# Patient Record
Sex: Female | Born: 1960 | Race: Black or African American | Hispanic: No | Smoking: Former smoker
Health system: Southern US, Community
[De-identification: ages and names within clinical notes are randomized; demographics above are authoritative.]

## PROBLEM LIST (undated history)

## (undated) HISTORY — PX: NO PAST SURGERIES: SHX2092

---

## 2000-12-04 ENCOUNTER — Ambulatory Visit (HOSPITAL_COMMUNITY): Admission: RE | Admit: 2000-12-04 | Discharge: 2000-12-04 | Payer: Self-pay | Admitting: Family Medicine

## 2002-02-13 ENCOUNTER — Ambulatory Visit (HOSPITAL_COMMUNITY): Admission: RE | Admit: 2002-02-13 | Discharge: 2002-02-13 | Payer: Self-pay | Admitting: Family Medicine

## 2004-03-21 ENCOUNTER — Emergency Department (HOSPITAL_COMMUNITY): Admission: EM | Admit: 2004-03-21 | Discharge: 2004-03-21 | Payer: Self-pay | Admitting: Emergency Medicine

## 2005-04-21 ENCOUNTER — Ambulatory Visit: Payer: Self-pay | Admitting: Family Medicine

## 2005-04-21 ENCOUNTER — Ambulatory Visit (HOSPITAL_COMMUNITY): Admission: RE | Admit: 2005-04-21 | Discharge: 2005-04-21 | Payer: Self-pay | Admitting: Internal Medicine

## 2005-10-07 ENCOUNTER — Ambulatory Visit: Payer: Self-pay | Admitting: Internal Medicine

## 2005-10-10 ENCOUNTER — Ambulatory Visit: Payer: Self-pay | Admitting: Internal Medicine

## 2006-11-25 ENCOUNTER — Emergency Department (HOSPITAL_COMMUNITY): Admission: EM | Admit: 2006-11-25 | Discharge: 2006-11-25 | Payer: Self-pay | Admitting: Emergency Medicine

## 2007-01-23 ENCOUNTER — Ambulatory Visit: Payer: Self-pay | Admitting: Internal Medicine

## 2007-01-26 ENCOUNTER — Ambulatory Visit: Payer: Self-pay | Admitting: Internal Medicine

## 2007-01-29 ENCOUNTER — Ambulatory Visit: Payer: Self-pay | Admitting: Internal Medicine

## 2007-06-25 ENCOUNTER — Encounter (INDEPENDENT_AMBULATORY_CARE_PROVIDER_SITE_OTHER): Payer: Self-pay | Admitting: Family Medicine

## 2007-06-25 ENCOUNTER — Ambulatory Visit: Payer: Self-pay | Admitting: Internal Medicine

## 2007-06-25 LAB — CONVERTED CEMR LAB
AST: 21 units/L (ref 0–37)
Albumin: 4 g/dL (ref 3.5–5.2)
Alkaline Phosphatase: 39 units/L (ref 39–117)
CO2: 20 meq/L (ref 19–32)
Calcium: 9 mg/dL (ref 8.4–10.5)
Chloride: 109 meq/L (ref 96–112)
Cholesterol: 199 mg/dL (ref 0–200)
Creatinine, Ser: 0.96 mg/dL (ref 0.40–1.20)
Eosinophils Relative: 1 % (ref 0–5)
HDL: 69 mg/dL (ref >39)
Hemoglobin: 11.9 g/dL — ABNORMAL LOW (ref 12.0–15.0)
Neutro Abs: 6.4 10*3/uL (ref 1.7–7.7)
Platelets: 298 10*3/uL (ref 150–400)
Potassium: 4.3 meq/L (ref 3.5–5.3)
Sodium: 140 meq/L (ref 135–145)
Total CHOL/HDL Ratio: 2.9
Total Protein: 6.8 g/dL (ref 6.0–8.3)
VLDL: 48 mg/dL — ABNORMAL HIGH (ref 0–40)
WBC: 9.1 10*3/uL (ref 4.0–10.5)

## 2007-06-27 ENCOUNTER — Ambulatory Visit (HOSPITAL_COMMUNITY): Admission: RE | Admit: 2007-06-27 | Discharge: 2007-06-27 | Payer: Self-pay | Admitting: Family Medicine

## 2007-12-11 ENCOUNTER — Emergency Department (HOSPITAL_COMMUNITY): Admission: EM | Admit: 2007-12-11 | Discharge: 2007-12-11 | Payer: Self-pay | Admitting: Emergency Medicine

## 2007-12-14 ENCOUNTER — Ambulatory Visit: Payer: Self-pay | Admitting: Internal Medicine

## 2008-01-22 ENCOUNTER — Emergency Department (HOSPITAL_COMMUNITY): Admission: EM | Admit: 2008-01-22 | Discharge: 2008-01-22 | Payer: Self-pay | Admitting: Emergency Medicine

## 2008-03-11 ENCOUNTER — Ambulatory Visit: Payer: Self-pay | Admitting: Family Medicine

## 2008-03-13 ENCOUNTER — Ambulatory Visit: Payer: Self-pay | Admitting: Internal Medicine

## 2010-02-21 HISTORY — PX: BREAST BIOPSY: SHX20

## 2010-10-26 ENCOUNTER — Other Ambulatory Visit: Payer: Self-pay

## 2010-12-02 LAB — POCT CARDIAC MARKERS
CKMB, poc: 1.7
CKMB, poc: 3.4
Myoglobin, poc: 108
Myoglobin, poc: 190
Operator id: 1211
Operator id: 3206
Troponin i, poc: <0.05
Troponin i, poc: <0.05

## 2010-12-02 LAB — DIFFERENTIAL
Basophils Absolute: 0.1
Basophils Relative: 1
Lymphocytes Relative: 20
Lymphs Abs: 2.1
Monocytes Absolute: 0.5
Monocytes Relative: 5
Neutro Abs: 7.8 — ABNORMAL HIGH

## 2010-12-02 LAB — CBC
Hemoglobin: 12.2
RBC: 4.03

## 2010-12-02 LAB — D-DIMER, QUANTITATIVE: D-Dimer, Quant: 0.56 — ABNORMAL HIGH

## 2010-12-02 LAB — BASIC METABOLIC PANEL
Calcium: 9.5
Chloride: 107
Glucose, Bld: 89
Potassium: 4.7

## 2010-12-06 ENCOUNTER — Other Ambulatory Visit (HOSPITAL_COMMUNITY): Payer: Self-pay | Admitting: Family Medicine

## 2010-12-06 DIAGNOSIS — Z1231 Encounter for screening mammogram for malignant neoplasm of breast: Secondary | ICD-10-CM

## 2010-12-17 ENCOUNTER — Ambulatory Visit (HOSPITAL_COMMUNITY)
Admission: RE | Admit: 2010-12-17 | Discharge: 2010-12-17 | Disposition: A | Discharge status: Home / Self Care | Payer: Self-pay | Source: Ambulatory Visit | Attending: Family Medicine | Admitting: Family Medicine

## 2010-12-17 DIAGNOSIS — Z1231 Encounter for screening mammogram for malignant neoplasm of breast: Secondary | ICD-10-CM | POA: Insufficient documentation

## 2010-12-23 ENCOUNTER — Other Ambulatory Visit: Payer: Self-pay | Admitting: Family Medicine

## 2010-12-23 DIAGNOSIS — R928 Other abnormal and inconclusive findings on diagnostic imaging of breast: Secondary | ICD-10-CM

## 2010-12-30 ENCOUNTER — Ambulatory Visit
Admission: RE | Admit: 2010-12-30 | Discharge: 2010-12-30 | Disposition: A | Discharge status: Home / Self Care | Payer: No Typology Code available for payment source | Source: Ambulatory Visit | Attending: Family Medicine | Admitting: Family Medicine

## 2010-12-30 ENCOUNTER — Other Ambulatory Visit: Payer: Self-pay | Admitting: Family Medicine

## 2010-12-30 DIAGNOSIS — R928 Other abnormal and inconclusive findings on diagnostic imaging of breast: Secondary | ICD-10-CM

## 2011-01-04 ENCOUNTER — Encounter: Payer: Self-pay | Admitting: *Deleted

## 2011-01-04 ENCOUNTER — Ambulatory Visit (INDEPENDENT_AMBULATORY_CARE_PROVIDER_SITE_OTHER): Payer: No Typology Code available for payment source | Admitting: *Deleted

## 2011-01-04 VITALS — BP 134/94 | HR 76 | Temp 97.8°F | Ht 65.5 in | Wt 244.5 lb

## 2011-01-04 DIAGNOSIS — N63 Unspecified lump in unspecified breast: Secondary | ICD-10-CM

## 2011-01-04 DIAGNOSIS — N6310 Unspecified lump in the right breast, unspecified quadrant: Secondary | ICD-10-CM

## 2011-01-04 DIAGNOSIS — Z1239 Encounter for other screening for malignant neoplasm of breast: Secondary | ICD-10-CM

## 2011-01-04 NOTE — Patient Instructions (Signed)
Taught patient how to perform BSE and gave educational materials to take home. Patient did not need a Pap smear today due to last Pap smear was in 10/26/10. Told patient about free cervical cancer screenings to receive a Pap smear if would like one within 1-2 years. Let her know [facility] will cover Pap smears every 3 years unless has a history of abnormal Pap smears. Patient is scheduled for a right breast biopsy tomorrow, November 14th at 1430 at the [facility] [place] . Patient aware of appointment and will be there. Let patient know will follow up with her within the next couple weeks with results either by letter or phone. Also, talked with patient about smoking cessation. Patient is ready to quit. Gave her information about the free smoking cessation classes offered at the [facility] if interested. Patient verbalized understanding.

## 2011-01-04 NOTE — Progress Notes (Signed)
Complaints of right breast lump.  Pap Smear:    Pap smear not performed today. Last Pap smear was 10/26/10 at [facility] and was normal. Per patient has no history of abnormal Pap smears. Per BCCCP guidelines patient does not need a Pap smear for 3 years if has no history of abnormal Pap smears. Told patient about free Pap smear screenings if would like a Pap smear at 1-2 years.  Physical exam: Breasts Breasts symmetrical. No skin abnormalities bilateral breasts. No nipple retraction bilateral breasts. No nipple discharge bilateral breasts. No lymphadenopathy. No lumps palpated left breast. Lump palpated in the right breast around 5 o'clock 5cm from nipple. No pain or tenderness bilateral breasts on palpation. Biopsy of right breast lump is scheduled at the [facility] for tomorrow November 14th at 1430.      Pelvic/Bimanual No Pap smear and no pelvic exam completed today since patients last Pap smear was a little over two months ago. Pap smear not indicated per BCCCP guidelines since last Pap smear was less than 3 years ago.

## 2011-01-05 ENCOUNTER — Ambulatory Visit
Admission: RE | Admit: 2011-01-05 | Discharge: 2011-01-05 | Disposition: A | Discharge status: Home / Self Care | Payer: No Typology Code available for payment source | Source: Ambulatory Visit | Attending: Family Medicine | Admitting: Family Medicine

## 2011-01-05 DIAGNOSIS — R928 Other abnormal and inconclusive findings on diagnostic imaging of breast: Secondary | ICD-10-CM

## 2011-12-21 ENCOUNTER — Other Ambulatory Visit: Payer: Self-pay | Admitting: Obstetrics and Gynecology

## 2011-12-21 DIAGNOSIS — Z1231 Encounter for screening mammogram for malignant neoplasm of breast: Secondary | ICD-10-CM

## 2011-12-27 ENCOUNTER — Encounter (HOSPITAL_COMMUNITY): Payer: Self-pay | Admitting: *Deleted

## 2012-01-10 ENCOUNTER — Encounter (HOSPITAL_COMMUNITY): Payer: Self-pay

## 2012-01-10 ENCOUNTER — Ambulatory Visit (HOSPITAL_COMMUNITY)
Admission: RE | Admit: 2012-01-10 | Discharge: 2012-01-10 | Disposition: A | Discharge status: Home / Self Care | Payer: Self-pay | Source: Ambulatory Visit | Attending: Obstetrics and Gynecology | Admitting: Obstetrics and Gynecology

## 2012-01-10 VITALS — BP 118/72 | Temp 98.2°F | Ht 63.0 in | Wt 234.6 lb

## 2012-01-10 DIAGNOSIS — Z1239 Encounter for other screening for malignant neoplasm of breast: Secondary | ICD-10-CM

## 2012-01-10 DIAGNOSIS — Z1231 Encounter for screening mammogram for malignant neoplasm of breast: Secondary | ICD-10-CM

## 2012-01-10 NOTE — Addendum Note (Signed)
Encounter addended by: Saintclair Halsted, RN on: 01/10/2012  8:52 AM<BR>     Documentation filed: Visit Diagnoses, Patient Instructions Section

## 2012-01-10 NOTE — Progress Notes (Signed)
No complaints today.  Pap Smear:    Pap smear not performed today. Patients last Pap smear was 10/26/2010 at [facility] and normal. Per patient she has no history of abnormal Pap smears. Pap smear result above is in EPIC.  Physical exam: Breasts Right breast slightly larger than left breast. No skin abnormalities bilateral breasts. No nipple retraction bilateral breasts. No nipple discharge bilateral breasts. No lymphadenopathy. No lumps palpated bilateral breasts. No complaints of pain or tenderness on exam. Patient to have screening mammogram completed after BCCCP appointment.         Pelvic/Bimanual No Pap smear completed today since last Pap smear was 10/26/2010 and normal. Pap smear not indicated per BCCCP guidelines.

## 2012-01-10 NOTE — Patient Instructions (Addendum)
Taught patient how to perform BSE. Patient did not need a Pap smear today due to last Pap smear was 10/26/2010. Let her know B[facility]CP will cover Pap smears every 3 years unless has a history of abnormal Pap smears. Discussed smoking cessation with patient. Patient completed Quit line referral and told patient about free smoking cessation classes offered at the [facility]. Informed patient on how to register for classes either online or phone number to call. Patient escorted to mammography for a screening mammogram. Let patient know will follow up with her within the next couple weeks with results by letter or phone. Patient verbalized understanding.

## 2012-02-23 NOTE — Addendum Note (Signed)
Encounter addended by: Saintclair Halsted, RN on: 02/23/2012  4:24 PM<BR>     Documentation filed: Visit Diagnoses, Charges VN

## 2013-08-07 ENCOUNTER — Other Ambulatory Visit (HOSPITAL_COMMUNITY): Payer: Self-pay | Admitting: Internal Medicine

## 2013-08-07 DIAGNOSIS — Z1231 Encounter for screening mammogram for malignant neoplasm of breast: Secondary | ICD-10-CM

## 2013-08-12 ENCOUNTER — Ambulatory Visit (HOSPITAL_COMMUNITY)
Admission: RE | Admit: 2013-08-12 | Discharge: 2013-08-12 | Disposition: A | Discharge status: Home / Self Care | Payer: No Typology Code available for payment source | Source: Ambulatory Visit | Attending: Internal Medicine | Admitting: Internal Medicine

## 2013-08-12 DIAGNOSIS — Z1231 Encounter for screening mammogram for malignant neoplasm of breast: Secondary | ICD-10-CM | POA: Insufficient documentation

## 2014-05-19 ENCOUNTER — Encounter: Payer: Self-pay | Admitting: Internal Medicine

## 2014-07-01 ENCOUNTER — Ambulatory Visit (AMBULATORY_SURGERY_CENTER): Payer: Self-pay

## 2014-07-01 VITALS — Ht 63.0 in | Wt 254.4 lb

## 2014-07-01 DIAGNOSIS — Z1211 Encounter for screening for malignant neoplasm of colon: Secondary | ICD-10-CM

## 2014-07-01 NOTE — Progress Notes (Signed)
Per pt, no allergies to soy or egg products.Pt not taking any weight loss meds or using  O2 at home. 

## 2014-07-10 ENCOUNTER — Encounter: Payer: Self-pay | Admitting: Internal Medicine

## 2014-07-10 ENCOUNTER — Ambulatory Visit (AMBULATORY_SURGERY_CENTER): Payer: 59 | Admitting: Internal Medicine

## 2014-07-10 ENCOUNTER — Other Ambulatory Visit: Payer: Self-pay | Admitting: Internal Medicine

## 2014-07-10 VITALS — BP 109/73 | HR 62 | Temp 96.2°F | Resp 17 | Ht 63.0 in | Wt 254.0 lb

## 2014-07-10 DIAGNOSIS — K635 Polyp of colon: Secondary | ICD-10-CM | POA: Diagnosis not present

## 2014-07-10 DIAGNOSIS — Z1211 Encounter for screening for malignant neoplasm of colon: Secondary | ICD-10-CM

## 2014-07-10 DIAGNOSIS — D120 Benign neoplasm of cecum: Secondary | ICD-10-CM | POA: Diagnosis not present

## 2014-07-10 DIAGNOSIS — D127 Benign neoplasm of rectosigmoid junction: Secondary | ICD-10-CM

## 2014-07-10 MED ORDER — SODIUM CHLORIDE 0.9 % IV SOLN: 500.0000 mL | INTRAVENOUS | Status: DC

## 2014-07-10 NOTE — Patient Instructions (Signed)
Discharge instructions given. Handouts on polyps and diverticulosis. Resume previous medications. YOU HAD AN ENDOSCOPIC PROCEDURE TODAY AT THE [facility]:   Refer to the procedure report that was given to you for any specific questions about what was found during the examination.  If the procedure report does not answer your questions, please call your gastroenterologist to clarify.  If you requested that your care partner not be given the details of your procedure findings, then the procedure report has been included in a sealed envelope for you to review at your convenience later.  YOU SHOULD EXPECT: Some feelings of bloating in the abdomen. Passage of more gas than usual.  Walking can help get rid of the air that was put into your GI tract during the procedure and reduce the bloating. If you had a lower endoscopy (such as a colonoscopy or flexible sigmoidoscopy) you may notice spotting of blood in your stool or on the toilet paper. If you underwent a bowel prep for your procedure, you may not have a normal bowel movement for a few days.  Please Note:  You might notice some irritation and congestion in your nose or some drainage.  This is from the oxygen used during your procedure.  There is no need for concern and it should clear up in a day or so.  SYMPTOMS TO REPORT IMMEDIATELY:   Following lower endoscopy (colonoscopy or flexible sigmoidoscopy):  Excessive amounts of blood in the stool  Significant tenderness or worsening of abdominal pains  Swelling of the abdomen that is new, acute  Fever of 100F or higher   For urgent or emergent issues, a gastroenterologist can be reached at any hour by calling [phone].   DIET: Your first meal following the procedure should be a small meal and then it is ok to progress to your normal diet. Heavy or fried foods are harder to digest and may make you feel nauseous or bloated.  Likewise, meals heavy in dairy and vegetables can  increase bloating.  Drink plenty of fluids but you should avoid alcoholic beverages for 24 hours.  ACTIVITY:  You should plan to take it easy for the rest of today and you should NOT DRIVE or use heavy machinery until tomorrow (because of the sedation medicines used during the test).    FOLLOW UP: Our staff will call the number listed on your records the next business day following your procedure to check on you and address any questions or concerns that you may have regarding the information given to you following your procedure. If we do not reach you, we will leave a message.  However, if you are feeling well and you are not experiencing any problems, there is no need to return our call.  We will assume that you have returned to your regular daily activities without incident.  If any biopsies were taken you will be contacted by phone or by letter within the next 1-3 weeks.  Please call us at [phone] if you have not heard about the biopsies in 3 weeks.    SIGNATURES/CONFIDENTIALITY: You and/or your care partner have signed paperwork which will be entered into your electronic medical record.  These signatures attest to the fact that that the information above on your After Visit Summary has been reviewed and is understood.  Full responsibility of the confidentiality of this discharge information lies with you and/or your care-partner. 

## 2014-07-10 NOTE — Progress Notes (Signed)
Called to room to assist during endoscopic procedure.  Patient ID and intended procedure confirmed with present staff. Received instructions for my participation in the procedure from the performing physician.  

## 2014-07-10 NOTE — Progress Notes (Signed)
To recovery, report to McCoy, RN, VSS 

## 2014-07-10 NOTE — Op Note (Signed)
[place]  [address] [place], [id number]   COLONOSCOPY PROCEDURE REPORT  PATIENT: Donna Huffman, Donna Huffman  MR#: [id number] BIRTHDATE: [birth year] , 54  yrs. old GENDER: female ENDOSCOPIST: Jerene Bears, MD REFERRED RA:XENMM Ardeth Perfect, M.D. PROCEDURE DATE:  07/10/2014 PROCEDURE:   Colonoscopy, screening and Colonoscopy with cold biopsy polypectomy First Screening Colonoscopy - Avg.  risk and is 50 yrs.  old or older Yes.  Prior Negative Screening - Now for repeat screening. N/A  History of Adenoma - Now for follow-up colonoscopy & has been > or = to 3 yrs.  N/A  Polyps removed today? Yes ASA CLASS:   Class II INDICATIONS:Screening for colonic neoplasia and Colorectal Neoplasm Risk Assessment for this procedure is average risk. MEDICATIONS: Monitored anesthesia care and Propofol 400 mg IV  DESCRIPTION OF PROCEDURE:   After the risks benefits and alternatives of the procedure were thoroughly explained, informed consent was obtained.  The digital rectal exam revealed no rectal mass.   The LB [id number]  endoscope was introduced through the anus and advanced to the cecum, which was identified by both the appendix and ileocecal valve. No adverse events experienced. The quality of the prep was good.  (Suprep was used)  The instrument was then slowly withdrawn as the colon was fully examined. Estimated blood loss is zero unless otherwise noted in this procedure report.   COLON FINDINGS: Three sessile polyps ranging between 3-31mm in size were found at the cecum (1) and in the rectosigmoid colon (2). Polypectomies were performed with cold forceps.  The resection was complete, the polyp tissue was completely retrieved and sent to histology.   There was moderate diverticulosis noted in the transverse colon and left colon with associated muscular hypertrophy.  Retroflexed views revealed internal hemorrhoids. The time to cecum = 2.1 Withdrawal time = 11.3   The scope  was withdrawn and the procedure completed. COMPLICATIONS: There were no immediate complications.  ENDOSCOPIC IMPRESSION: 1.   Three sessile polyps ranging between 3-81mm in size were found at the cecum and in the rectosigmoid colon; polypectomies were performed with cold forceps 2.   There was moderate diverticulosis noted in the transverse colon and left colon  RECOMMENDATIONS: 1.  Await pathology results 2.  High fiber diet 3.  Timing of repeat colonoscopy will be determined by pathology findings. 4.  You will receive a letter within 1-2 weeks with the results of your biopsy as well as final recommendations.  Please call my office if you have not received a letter after 3 weeks.  eSigned:  Jerene Bears, MD 07/10/2014 8:22 AM  cc:  the patient, Velna Hatchet, MD

## 2014-07-11 ENCOUNTER — Telehealth: Payer: Self-pay | Admitting: *Deleted

## 2014-07-11 NOTE — Telephone Encounter (Signed)
No answer, message left for the patient. 

## 2014-07-16 ENCOUNTER — Encounter: Payer: Self-pay | Admitting: Internal Medicine

## 2015-05-18 ENCOUNTER — Encounter (HOSPITAL_COMMUNITY): Payer: Self-pay

## 2015-05-18 ENCOUNTER — Emergency Department (HOSPITAL_COMMUNITY)
Admission: EM | Admit: 2015-05-18 | Discharge: 2015-05-18 | Disposition: A | Discharge status: Home / Self Care | Payer: Self-pay | Attending: Emergency Medicine | Admitting: Emergency Medicine

## 2015-05-18 ENCOUNTER — Emergency Department (HOSPITAL_COMMUNITY): Payer: Self-pay

## 2015-05-18 DIAGNOSIS — F17210 Nicotine dependence, cigarettes, uncomplicated: Secondary | ICD-10-CM | POA: Insufficient documentation

## 2015-05-18 DIAGNOSIS — R42 Dizziness and giddiness: Secondary | ICD-10-CM | POA: Insufficient documentation

## 2015-05-18 LAB — COMPREHENSIVE METABOLIC PANEL
ALT: 29 U/L (ref 14–54)
ANION GAP: 10 (ref 5–15)
AST: 29 U/L (ref 15–41)
Albumin: 4.2 g/dL (ref 3.5–5.0)
Alkaline Phosphatase: 39 U/L (ref 38–126)
BUN: 22 mg/dL — ABNORMAL HIGH (ref 6–20)
CO2: 22 mmol/L (ref 22–32)
Calcium: 9.5 mg/dL (ref 8.9–10.3)
Chloride: 110 mmol/L (ref 101–111)
Creatinine, Ser: 0.95 mg/dL (ref 0.44–1.00)
GFR calc Af Amer: >60 mL/min (ref >60)
GFR calc non Af Amer: >60 mL/min (ref >60)
Glucose, Bld: 136 mg/dL — ABNORMAL HIGH (ref 65–99)
POTASSIUM: 4 mmol/L (ref 3.5–5.1)
SODIUM: 142 mmol/L (ref 135–145)
Total Bilirubin: 0.4 mg/dL (ref 0.3–1.2)
Total Protein: 7.5 g/dL (ref 6.5–8.1)

## 2015-05-18 LAB — CBC
HCT: 43.2 % (ref 36.0–46.0)
HEMOGLOBIN: 13.8 g/dL (ref 12.0–15.0)
MCH: 29.7 pg (ref 26.0–34.0)
MCHC: 31.9 g/dL (ref 30.0–36.0)
MCV: 92.9 fL (ref 78.0–100.0)
PLATELETS: 285 10*3/uL (ref 150–400)
RBC: 4.65 MIL/uL (ref 3.87–5.11)
RDW: 14 % (ref 11.5–15.5)
WBC: 8.4 10*3/uL (ref 4.0–10.5)

## 2015-05-18 MED ORDER — MECLIZINE HCL 25 MG PO TABS: ORAL_TABLET | ORAL | Status: DC

## 2015-05-18 MED ORDER — ONDANSETRON HCL 4 MG/2ML IJ SOLN
4.0000 mg | Freq: Once | INTRAMUSCULAR | Status: AC
  Administered 2015-05-18: 4 mg via INTRAVENOUS
  Filled 2015-05-18: qty 2

## 2015-05-18 MED ORDER — ONDANSETRON 4 MG PO TBDP: ORAL_TABLET | ORAL | Status: DC

## 2015-05-18 MED ORDER — MECLIZINE HCL 25 MG PO TABS
25.0000 mg | ORAL_TABLET | Freq: Once | ORAL | Status: AC
  Administered 2015-05-18: 25 mg via ORAL
  Filled 2015-05-18: qty 1

## 2015-05-18 MED ORDER — SODIUM CHLORIDE 0.9 % IV BOLUS (SEPSIS)
1000.0000 mL | Freq: Once | INTRAVENOUS | Status: AC
  Administered 2015-05-18: 1000 mL via INTRAVENOUS

## 2015-05-18 NOTE — ED Provider Notes (Signed)
CSN: [id number]     Arrival date & time 05/18/15  0932 History   First MD Initiated Contact with Patient 05/18/15 1043     Chief Complaint  Patient presents with  . Dizziness     (Consider location/radiation/quality/duration/timing/severity/associated sxs/prior Treatment) Patient is a 55 y.o. female presenting with dizziness. The history is provided by the patient (Patient complains of dizziness which started today she states room is spinning).  Dizziness Quality:  Head spinning Severity:  Moderate Onset quality:  Sudden Timing:  Constant Progression:  Waxing and waning Chronicity:  New Context: not when bending over   Associated symptoms: no chest pain, no diarrhea and no headaches     History reviewed. No pertinent past medical history. Past Surgical History  Procedure Laterality Date  . No past surgeries     Family History  Problem Relation Age of Onset  . Cancer Maternal Grandmother 30    cervical  . Diabetes Father   . Cancer Mother 3    breast  . Breast cancer Mother    Social History  Substance Use Topics  . Smoking status: Current Every Day Smoker -- 0.25 packs/day for 20 years    Types: Cigarettes  . Smokeless tobacco: Never Used  . Alcohol Use: Yes     Comment: socially   OB History    Gravida Para Term Preterm AB TAB SAB Ectopic Multiple Living   0              Review of Systems  Constitutional: Negative for appetite change and fatigue.  HENT: Negative for congestion, ear discharge and sinus pressure.   Eyes: Negative for discharge.  Respiratory: Negative for cough.   Cardiovascular: Negative for chest pain.  Gastrointestinal: Negative for abdominal pain and diarrhea.  Genitourinary: Negative for frequency and hematuria.  Musculoskeletal: Negative for back pain.  Skin: Negative for rash.  Neurological: Positive for dizziness. Negative for seizures and headaches.  Psychiatric/Behavioral: Negative for hallucinations.      Allergies  Eggs or  egg-derived products and Shellfish allergy  Home Medications   Prior to Admission medications   Medication Sig Start Date End Date Taking? Authorizing Provider  acetaminophen (TYLENOL) 650 MG CR tablet Take 650 mg by mouth daily as needed for pain.   Yes Historical Provider, MD  IRON PO Take 2 tablets by mouth daily as needed (fatigue, drowsiness).   Yes Historical Provider, MD  meclizine (ANTIVERT) 25 MG tablet Take 1 every 4-6 hours for dizziness 05/18/15   Milton Ferguson, MD  ondansetron (ZOFRAN ODT) 4 MG disintegrating tablet 4mg  ODT q4 hours prn nausea/vomit 05/18/15   Milton Ferguson, MD   BP 114/91 mmHg  Pulse 72  Temp(Src) 97.5 F (36.4 C) (Oral)  Resp 18  SpO2 96% Physical Exam  Constitutional: She is oriented to person, place, and time. She appears well-developed.  HENT:  Head: Normocephalic.  Eyes: Conjunctivae and EOM are normal. No scleral icterus.  Neck: Neck supple. No thyromegaly present.  Cardiovascular: Normal rate and regular rhythm.  Exam reveals no gallop and no friction rub.   No murmur heard. Pulmonary/Chest: No stridor. She has no wheezes. She has no rales. She exhibits no tenderness.  Abdominal: She exhibits no distension. There is no tenderness. There is no rebound.  Musculoskeletal: Normal range of motion. She exhibits no edema.  Lymphadenopathy:    She has no cervical adenopathy.  Neurological: She is oriented to person, place, and time. She exhibits normal muscle tone. Coordination normal.  Skin: No  rash noted. No erythema.  Psychiatric: She has a normal mood and affect. Her behavior is normal.    ED Course  Procedures (including critical care time) Labs Review Labs Reviewed  COMPREHENSIVE METABOLIC PANEL - Abnormal; Notable for the following:    Glucose, Bld 136 (*)    BUN 22 (*)    All other components within normal limits  CBC    Imaging Review Ct Head Wo Contrast  05/18/2015  CLINICAL DATA:  Onset of dizziness and nausea this morning. EXAM:  CT HEAD WITHOUT CONTRAST TECHNIQUE: Contiguous axial images were obtained from the base of the skull through the vertex without intravenous contrast. COMPARISON:  None. FINDINGS: No mass lesion. No midline shift. No acute hemorrhage or hematoma. No extra-axial fluid collections. No evidence of acute infarction. Brain parenchyma is normal. Bones are normal. IMPRESSION: Normal exam. Electronically Signed   By: Lorriane Shire M.D.   On: 05/18/2015 12:49   I have personally reviewed and evaluated these images and lab results as part of my medical decision-making.   EKG Interpretation None      MDM   Final diagnoses:  Vertigo   Patient of vertigo patient improved with treatment she'll be sent home with Antivert and Zofran    Milton Ferguson, MD 05/18/15 1517

## 2015-05-18 NOTE — Discharge Instructions (Signed)
Follow-up with your family doctor next week for recheck. 

## 2015-05-18 NOTE — ED Notes (Addendum)
Pt c/o dizziness and nausea starting this morning.  Denies pain.  Pt reports "it feels like my ears have water in them."  Sts "I'm having a hard time getting my balance."  Sts turning head makes symptoms worse.

## 2016-04-06 ENCOUNTER — Other Ambulatory Visit: Payer: Self-pay | Admitting: Internal Medicine

## 2016-04-06 DIAGNOSIS — Z1231 Encounter for screening mammogram for malignant neoplasm of breast: Secondary | ICD-10-CM

## 2016-04-07 ENCOUNTER — Ambulatory Visit
Admission: RE | Admit: 2016-04-07 | Discharge: 2016-04-07 | Disposition: A | Discharge status: Home / Self Care | Payer: No Typology Code available for payment source | Source: Ambulatory Visit | Attending: Internal Medicine | Admitting: Internal Medicine

## 2016-04-07 DIAGNOSIS — Z1231 Encounter for screening mammogram for malignant neoplasm of breast: Secondary | ICD-10-CM

## 2016-05-19 ENCOUNTER — Other Ambulatory Visit: Payer: Self-pay | Admitting: Obstetrics & Gynecology

## 2016-05-23 LAB — CYTOLOGY - PAP

## 2016-12-26 ENCOUNTER — Encounter (HOSPITAL_COMMUNITY): Payer: Self-pay

## 2017-03-27 ENCOUNTER — Emergency Department (HOSPITAL_COMMUNITY)
Admission: EM | Admit: 2017-03-27 | Discharge: 2017-03-27 | Disposition: A | Discharge status: Home / Self Care | Payer: No Typology Code available for payment source | Attending: Emergency Medicine | Admitting: Emergency Medicine

## 2017-03-27 ENCOUNTER — Encounter (HOSPITAL_COMMUNITY): Payer: Self-pay | Admitting: Emergency Medicine

## 2017-03-27 ENCOUNTER — Emergency Department (HOSPITAL_COMMUNITY): Payer: No Typology Code available for payment source

## 2017-03-27 DIAGNOSIS — M549 Dorsalgia, unspecified: Secondary | ICD-10-CM | POA: Insufficient documentation

## 2017-03-27 DIAGNOSIS — Z79899 Other long term (current) drug therapy: Secondary | ICD-10-CM | POA: Insufficient documentation

## 2017-03-27 DIAGNOSIS — M7918 Myalgia, other site: Secondary | ICD-10-CM | POA: Insufficient documentation

## 2017-03-27 DIAGNOSIS — M79662 Pain in left lower leg: Secondary | ICD-10-CM | POA: Insufficient documentation

## 2017-03-27 DIAGNOSIS — Z91013 Allergy to seafood: Secondary | ICD-10-CM | POA: Insufficient documentation

## 2017-03-27 DIAGNOSIS — F17210 Nicotine dependence, cigarettes, uncomplicated: Secondary | ICD-10-CM | POA: Insufficient documentation
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
## 2017-03-27 NOTE — Discharge Instructions (Signed)
For pain control please take ibuprofen (also known as Motrin or Advil) 800mg  (this is normally 4 over the counter pills) 3 times a day  for 5 days. Take with food to minimize stomach irritation.  Please follow with your primary care doctor in the next 2 days for a check-up. They must obtain records for further management.   Do not hesitate to return to the Emergency Department for any new, worsening or concerning symptoms.
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
## 2018-01-28 ENCOUNTER — Emergency Department (HOSPITAL_COMMUNITY): Payer: No Typology Code available for payment source

## 2018-01-28 ENCOUNTER — Other Ambulatory Visit: Payer: Self-pay

## 2018-01-28 ENCOUNTER — Emergency Department (HOSPITAL_COMMUNITY)
Admission: EM | Admit: 2018-01-28 | Discharge: 2018-01-29 | Disposition: A | Discharge status: Home / Self Care | Payer: No Typology Code available for payment source | Attending: Emergency Medicine | Admitting: Emergency Medicine

## 2018-01-28 ENCOUNTER — Encounter (HOSPITAL_COMMUNITY): Payer: Self-pay | Admitting: Emergency Medicine

## 2018-01-28 DIAGNOSIS — M79652 Pain in left thigh: Secondary | ICD-10-CM | POA: Insufficient documentation

## 2018-01-28 DIAGNOSIS — R040 Epistaxis: Secondary | ICD-10-CM | POA: Diagnosis not present

## 2018-01-28 DIAGNOSIS — Y999 Unspecified external cause status: Secondary | ICD-10-CM | POA: Insufficient documentation

## 2018-01-28 DIAGNOSIS — Y92410 Unspecified street and highway as the place of occurrence of the external cause: Secondary | ICD-10-CM | POA: Insufficient documentation

## 2018-01-28 DIAGNOSIS — Z79899 Other long term (current) drug therapy: Secondary | ICD-10-CM | POA: Diagnosis not present

## 2018-01-28 DIAGNOSIS — F17210 Nicotine dependence, cigarettes, uncomplicated: Secondary | ICD-10-CM | POA: Insufficient documentation

## 2018-01-28 DIAGNOSIS — R52 Pain, unspecified: Secondary | ICD-10-CM

## 2018-01-28 DIAGNOSIS — M25552 Pain in left hip: Secondary | ICD-10-CM | POA: Insufficient documentation

## 2018-01-28 MED ORDER — HYDROCODONE-ACETAMINOPHEN 5-325 MG PO TABS: 2.0000 | ORAL_TABLET | ORAL | 0 refills | Status: DC | PRN

## 2018-01-28 MED ORDER — HYDROCODONE-ACETAMINOPHEN 5-325 MG PO TABS
2.0000 | ORAL_TABLET | Freq: Once | ORAL | Status: AC
  Administered 2018-01-29: 2 via ORAL
  Filled 2018-01-28: qty 2
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
## 2018-01-29 NOTE — ED Notes (Signed)
Patient verbalizes understanding of discharge instructions. Opportunity for questioning and answers were provided. Armband removed by staff, pt discharged from ED in wheelchair with family.  

## 2018-01-29 NOTE — ED Notes (Signed)
Family at bedside. 

## 2019-06-03 ENCOUNTER — Other Ambulatory Visit: Payer: Self-pay

## 2019-06-03 DIAGNOSIS — Z1231 Encounter for screening mammogram for malignant neoplasm of breast: Secondary | ICD-10-CM

## 2019-06-27 ENCOUNTER — Other Ambulatory Visit: Payer: Self-pay

## 2019-06-27 ENCOUNTER — Ambulatory Visit: Payer: Self-pay | Admitting: Medical

## 2019-06-27 ENCOUNTER — Ambulatory Visit
Admission: RE | Admit: 2019-06-27 | Discharge: 2019-06-27 | Disposition: A | Discharge status: Home / Self Care | Payer: No Typology Code available for payment source | Source: Ambulatory Visit | Attending: Obstetrics and Gynecology | Admitting: Obstetrics and Gynecology

## 2019-06-27 VITALS — BP 116/84 | Temp 97.0°F | Wt 271.0 lb

## 2019-06-27 DIAGNOSIS — Z1231 Encounter for screening mammogram for malignant neoplasm of breast: Secondary | ICD-10-CM

## 2019-06-27 DIAGNOSIS — N898 Other specified noninflammatory disorders of vagina: Secondary | ICD-10-CM

## 2019-06-27 NOTE — Progress Notes (Signed)
[name] is a 59 y.o. female who presents to [facility] clinic today with no complaints.    Pap Smear: Pap not smear completed today. Last Pap smear was 05/16/2016 at [place] was normal. Per patient has no history of an abnormal Pap smear. Last Pap smear result is available in Epic.   Physical exam: Breasts Breasts asymmetrical, right larger than left. No skin abnormalities bilateral breasts. No nipple retraction bilateral breasts. No nipple discharge bilateral breasts. No lymphadenopathy. No lumps palpated bilateral breasts.       Pelvic/Bimanual Pap is not indicated today    Smoking History: Patient has is a current smoker at 0.25 packs per day. Referred to quit line.    Patient Navigation: Patient education provided. Access to services provided for patient through [facility] program.    Colorectal Cancer Screening: Per patient has never had colonoscopy completed No complaints today.    Breast and Cervical Cancer Risk Assessment: Patient has family history of breast cancer (mother, maternal GM, 3 paternal aunts), no known genetic mutations, or radiation treatment to the chest before age 46. Patient does not have history of cervical dysplasia, immunocompromised, or DES exposure in-utero.  Risk Assessment    Risk Scores      06/27/2019   Last edited by: Demetrius Revel, LPN   5-year risk: 2.3 %   Lifetime risk: 11.4 %          A: [facility] exam without pap smear Vaginal discharge with irritation   P: Referred patient to the [facility] for a screening mammogram. Appointment scheduled 06/27/19 @ 3:10 pm. Referred to [facility] for further evaluation  Financial aid information given today   Luvenia Redden, PA-C 06/27/2019 2:14 PM

## 2019-07-30 ENCOUNTER — Ambulatory Visit (INDEPENDENT_AMBULATORY_CARE_PROVIDER_SITE_OTHER): Payer: Self-pay | Admitting: *Deleted

## 2019-07-30 ENCOUNTER — Other Ambulatory Visit (HOSPITAL_COMMUNITY)
Admission: RE | Admit: 2019-07-30 | Discharge: 2019-07-30 | Disposition: A | Discharge status: Home / Self Care | Payer: No Typology Code available for payment source | Source: Ambulatory Visit | Attending: Medical | Admitting: Medical

## 2019-07-30 ENCOUNTER — Other Ambulatory Visit: Payer: Self-pay

## 2019-07-30 ENCOUNTER — Encounter: Payer: Self-pay | Admitting: *Deleted

## 2019-07-30 VITALS — BP 120/82 | HR 72 | Temp 98.0°F | Ht 63.0 in | Wt 271.0 lb

## 2019-07-30 DIAGNOSIS — N898 Other specified noninflammatory disorders of vagina: Secondary | ICD-10-CM | POA: Insufficient documentation

## 2019-07-30 NOTE — Progress Notes (Signed)
Pt reports having yellow vaginal discharge w/odor for many months. She also has intermittent vaginal itching. Pt states she was treated in September 2020 for vaginal infection and the sx never went away completely. She experiences frequent wetness in her panties and does not think it is perspiration. Self swab obtained. Pt will be called with results as she does not have MyChart.

## 2019-07-30 NOTE — Progress Notes (Signed)
Chart reviewed for nurse visit. Agree with plan of care.   Jorje Guild, NP 07/30/2019 12:55 PM

## 2019-07-31 LAB — CERVICOVAGINAL ANCILLARY ONLY
Bacterial Vaginitis (gardnerella): POSITIVE — AB
Candida Glabrata: NEGATIVE
Candida Vaginitis: NEGATIVE
Chlamydia: NEGATIVE
Comment: NEGATIVE
Comment: NEGATIVE
Comment: NEGATIVE
Comment: NEGATIVE
Comment: NEGATIVE
Comment: NORMAL
Neisseria Gonorrhea: NEGATIVE
Trichomonas: POSITIVE — AB

## 2019-08-02 ENCOUNTER — Telehealth: Payer: Self-pay | Admitting: Lactation Services

## 2019-08-02 MED ORDER — METRONIDAZOLE 500 MG PO TABS: 500.0000 mg | ORAL_TABLET | Freq: Two times a day (BID) | ORAL | 0 refills | Status: DC

## 2019-08-02 NOTE — Telephone Encounter (Signed)
Called patient to inform her of lab results.   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   Advised patient that Flagyl will be sent into pharmacy and that she should take entire dosage. Reviewed with patient to avoid alcohol while taking.   Reviewed with patient that partner needs to contact [facility] Department or PCP for treatment. Reviewed no sexual intercourse for 2 weeks after both partners are treated.   Patient with no questions or concerns at this time. Patient to call as needed.

## 2019-12-05 ENCOUNTER — Emergency Department (HOSPITAL_COMMUNITY)
Admission: EM | Admit: 2019-12-05 | Discharge: 2019-12-05 | Disposition: A | Discharge status: Home / Self Care | Payer: No Typology Code available for payment source | Attending: Emergency Medicine | Admitting: Emergency Medicine

## 2019-12-05 ENCOUNTER — Encounter (HOSPITAL_COMMUNITY): Payer: Self-pay | Admitting: Emergency Medicine

## 2019-12-05 ENCOUNTER — Ambulatory Visit (HOSPITAL_COMMUNITY)
Admission: EM | Admit: 2019-12-05 | Discharge: 2019-12-05 | Disposition: A | Discharge status: Home / Self Care | Payer: Self-pay | Attending: Family Medicine | Admitting: Family Medicine

## 2019-12-05 ENCOUNTER — Emergency Department (HOSPITAL_COMMUNITY): Payer: No Typology Code available for payment source

## 2019-12-05 ENCOUNTER — Other Ambulatory Visit: Payer: Self-pay

## 2019-12-05 DIAGNOSIS — M546 Pain in thoracic spine: Secondary | ICD-10-CM | POA: Diagnosis present

## 2019-12-05 DIAGNOSIS — M545 Low back pain, unspecified: Secondary | ICD-10-CM | POA: Diagnosis not present

## 2019-12-05 DIAGNOSIS — R519 Headache, unspecified: Secondary | ICD-10-CM

## 2019-12-05 DIAGNOSIS — Y9319 Activity, other involving water and watercraft: Secondary | ICD-10-CM | POA: Diagnosis not present

## 2019-12-05 DIAGNOSIS — M791 Myalgia, unspecified site: Secondary | ICD-10-CM

## 2019-12-05 DIAGNOSIS — F17210 Nicotine dependence, cigarettes, uncomplicated: Secondary | ICD-10-CM | POA: Insufficient documentation

## 2019-12-05 DIAGNOSIS — Y92410 Unspecified street and highway as the place of occurrence of the external cause: Secondary | ICD-10-CM | POA: Diagnosis not present

## 2019-12-05 DIAGNOSIS — R079 Chest pain, unspecified: Secondary | ICD-10-CM | POA: Diagnosis not present

## 2019-12-05 DIAGNOSIS — F0781 Postconcussional syndrome: Secondary | ICD-10-CM | POA: Insufficient documentation

## 2019-12-05 DIAGNOSIS — R1111 Vomiting without nausea: Secondary | ICD-10-CM

## 2019-12-05 MED ORDER — ACETAMINOPHEN ER 650 MG PO TBCR: 650.0000 mg | EXTENDED_RELEASE_TABLET | Freq: Three times a day (TID) | ORAL | 0 refills | Status: DC

## 2019-12-05 MED ORDER — HYDROCODONE-ACETAMINOPHEN 5-325 MG PO TABS
1.0000 | ORAL_TABLET | Freq: Once | ORAL | Status: AC
  Administered 2019-12-05: 1 via ORAL
  Filled 2019-12-05: qty 1

## 2019-12-05 MED ORDER — IBUPROFEN 400 MG PO TABS: 400.0000 mg | ORAL_TABLET | Freq: Four times a day (QID) | ORAL | 0 refills | Status: DC | PRN

## 2019-12-05 MED ORDER — METHOCARBAMOL 500 MG PO TABS
500.0000 mg | ORAL_TABLET | Freq: Once | ORAL | Status: AC
  Administered 2019-12-05: 500 mg via ORAL
  Filled 2019-12-05: qty 1

## 2019-12-05 MED ORDER — NAPROXEN 500 MG PO TABS: 500.0000 mg | ORAL_TABLET | Freq: Two times a day (BID) | ORAL | 0 refills | Status: DC

## 2019-12-05 MED ORDER — ACETAMINOPHEN ER 650 MG PO TBCR: 650.0000 mg | EXTENDED_RELEASE_TABLET | Freq: Every day | ORAL | 0 refills | Status: DC | PRN
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
## 2021-01-05 ENCOUNTER — Emergency Department (HOSPITAL_COMMUNITY): Payer: No Typology Code available for payment source

## 2021-01-05 ENCOUNTER — Emergency Department (HOSPITAL_COMMUNITY)
Admission: EM | Admit: 2021-01-05 | Discharge: 2021-01-05 | Disposition: A | Discharge status: Home / Self Care | Payer: No Typology Code available for payment source | Attending: Emergency Medicine | Admitting: Emergency Medicine

## 2021-01-05 ENCOUNTER — Encounter (HOSPITAL_COMMUNITY): Payer: Self-pay | Admitting: Emergency Medicine

## 2021-01-05 DIAGNOSIS — F17210 Nicotine dependence, cigarettes, uncomplicated: Secondary | ICD-10-CM | POA: Insufficient documentation

## 2021-01-05 DIAGNOSIS — S8992XA Unspecified injury of left lower leg, initial encounter: Secondary | ICD-10-CM | POA: Insufficient documentation

## 2021-01-05 DIAGNOSIS — S79912A Unspecified injury of left hip, initial encounter: Secondary | ICD-10-CM | POA: Insufficient documentation

## 2021-01-05 DIAGNOSIS — M25562 Pain in left knee: Secondary | ICD-10-CM

## 2021-01-05 DIAGNOSIS — Y92481 Parking lot as the place of occurrence of the external cause: Secondary | ICD-10-CM | POA: Insufficient documentation

## 2021-01-05 DIAGNOSIS — M25552 Pain in left hip: Secondary | ICD-10-CM

## 2021-01-05 MED ORDER — IBUPROFEN 200 MG PO TABS
600.0000 mg | ORAL_TABLET | Freq: Once | ORAL | Status: AC
  Administered 2021-01-05: 600 mg via ORAL

## 2021-01-05 MED ORDER — IBUPROFEN 200 MG PO TABS
600.0000 mg | ORAL_TABLET | Freq: Once | ORAL | Status: DC
  Filled 2021-01-05: qty 3
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
## 2021-01-19 ENCOUNTER — Ambulatory Visit (HOSPITAL_COMMUNITY)
Admission: EM | Admit: 2021-01-19 | Discharge: 2021-01-19 | Disposition: A | Discharge status: Home / Self Care | Payer: No Typology Code available for payment source | Attending: Physician Assistant | Admitting: Physician Assistant

## 2021-01-19 ENCOUNTER — Other Ambulatory Visit: Payer: Self-pay

## 2021-01-19 ENCOUNTER — Encounter (HOSPITAL_COMMUNITY): Payer: Self-pay

## 2021-01-19 DIAGNOSIS — M25562 Pain in left knee: Secondary | ICD-10-CM

## 2021-01-19 DIAGNOSIS — M25552 Pain in left hip: Secondary | ICD-10-CM

## 2021-01-19 DIAGNOSIS — M79605 Pain in left leg: Secondary | ICD-10-CM

## 2021-01-19 MED ORDER — IBUPROFEN 800 MG PO TABS: 800.0000 mg | ORAL_TABLET | Freq: Three times a day (TID) | ORAL | 0 refills | Status: DC

## 2021-01-19 MED ORDER — BACLOFEN 10 MG PO TABS: 10.0000 mg | ORAL_TABLET | Freq: Every evening | ORAL | 0 refills | Status: DC | PRN
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
## 2021-01-19 NOTE — Discharge Instructions (Signed)
I believe that you have some continued soreness to continue to heal.  Please avoid strenuous activity but to use heat and gentle stretch for symptom relief.  I think it is worthwhile to follow-up with sports medicine provider so please call them to schedule an appointment as you may benefit from physical therapy or other interventions.  Take ibuprofen up to 3 times a day.  Do not take additional NSAIDs including aspirin, Profen/Advil, naproxen/Aleve with this medication as it can cause stomach bleeding.  You can use Tylenol for breakthrough pain.  Take baclofen at night.  This will make you sleepy so not drive or drink alcohol while taking it.  If you have any worsening symptoms including numbness, tingling sensation, difficulty walking you need to be seen immediately.

## 2021-02-03 ENCOUNTER — Encounter: Payer: Self-pay | Admitting: Orthopaedic Surgery

## 2021-02-03 ENCOUNTER — Other Ambulatory Visit: Payer: Self-pay

## 2021-02-03 ENCOUNTER — Ambulatory Visit (INDEPENDENT_AMBULATORY_CARE_PROVIDER_SITE_OTHER): Payer: Self-pay | Admitting: Orthopaedic Surgery

## 2021-02-03 DIAGNOSIS — M1712 Unilateral primary osteoarthritis, left knee: Secondary | ICD-10-CM

## 2021-02-03 MED ORDER — METHYLPREDNISOLONE ACETATE 40 MG/ML IJ SUSP
40.0000 mg | INTRAMUSCULAR | Status: AC | PRN
  Administered 2021-02-03: 09:00:00 40 mg via INTRA_ARTICULAR

## 2021-02-03 MED ORDER — BUPIVACAINE HCL 0.25 % IJ SOLN
2.0000 mL | INTRAMUSCULAR | Status: AC | PRN
  Administered 2021-02-03: 09:00:00 2 mL via INTRA_ARTICULAR

## 2021-02-03 MED ORDER — LIDOCAINE HCL 1 % IJ SOLN
2.0000 mL | INTRAMUSCULAR | Status: AC | PRN
Start: 2021-02-03 — End: 2021-02-03
  Administered 2021-02-03: 09:00:00 2 mL
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
## 2021-06-24 ENCOUNTER — Telehealth: Payer: Self-pay | Admitting: Orthopaedic Surgery

## 2021-06-24 NOTE — Telephone Encounter (Signed)
Re-faxed records to [facility] [place] office Andreas Ohm [phone].  ?

## 2021-12-16 ENCOUNTER — Ambulatory Visit: Payer: Self-pay | Admitting: Orthopaedic Surgery

## 2021-12-21 ENCOUNTER — Ambulatory Visit (INDEPENDENT_AMBULATORY_CARE_PROVIDER_SITE_OTHER): Payer: Self-pay

## 2021-12-21 ENCOUNTER — Encounter: Payer: Self-pay | Admitting: Orthopaedic Surgery

## 2021-12-21 ENCOUNTER — Ambulatory Visit (INDEPENDENT_AMBULATORY_CARE_PROVIDER_SITE_OTHER): Payer: Self-pay | Admitting: Orthopaedic Surgery

## 2021-12-21 VITALS — Ht 63.0 in | Wt 230.0 lb

## 2021-12-21 DIAGNOSIS — M5442 Lumbago with sciatica, left side: Secondary | ICD-10-CM

## 2021-12-21 DIAGNOSIS — M1712 Unilateral primary osteoarthritis, left knee: Secondary | ICD-10-CM

## 2021-12-21 DIAGNOSIS — G8929 Other chronic pain: Secondary | ICD-10-CM

## 2021-12-21 DIAGNOSIS — M25562 Pain in left knee: Secondary | ICD-10-CM

## 2021-12-21 MED ORDER — DICLOFENAC SODIUM 75 MG PO TBEC: 75.0000 mg | DELAYED_RELEASE_TABLET | Freq: Two times a day (BID) | ORAL | 2 refills | Status: DC | PRN

## 2021-12-21 MED ORDER — LIDOCAINE HCL 1 % IJ SOLN
2.0000 mL | INTRAMUSCULAR | Status: AC | PRN
  Administered 2021-12-21: 2 mL

## 2021-12-21 MED ORDER — METHYLPREDNISOLONE ACETATE 40 MG/ML IJ SUSP
40.0000 mg | INTRAMUSCULAR | Status: AC | PRN
  Administered 2021-12-21: 40 mg via INTRA_ARTICULAR

## 2021-12-21 MED ORDER — BUPIVACAINE HCL 0.5 % IJ SOLN
2.0000 mL | INTRAMUSCULAR | Status: AC | PRN
  Administered 2021-12-21: 2 mL via INTRA_ARTICULAR

## 2021-12-21 NOTE — Progress Notes (Signed)
Office Visit Note   Patient: Donna Huffman           Date of Birth: 11-Jul-1960           MRN: [id number] Visit Date: 12/21/2021              Requested by: Velna Hatchet, MD [address] [place],  [place] [address] PCP: Velna Hatchet, MD   Assessment & Plan: Visit Diagnoses:  1. Chronic left-sided low back pain with left-sided sciatica   2. Unilateral primary osteoarthritis, left knee     Plan: Impression is left knee osteoarthritis flareup in addition to chronic left-sided low back pain.  Regards to the left knee, patient would like to proceed with cortisone injection today.  In regards to the back, we have discussed starting her in outpatient physical therapy and on a different NSAID that I will call in.  She is agreeable to this plan.  She will follow-up as needed.  Follow-Up Instructions: Return if symptoms worsen or fail to improve.   Orders:  Orders Placed This Encounter  Procedures   XR Lumbar Spine 2-3 Views   XR KNEE 3 VIEW LEFT   No orders of the defined types were placed in this encounter.     Procedures: Large Joint Inj: L knee on 12/21/2021 9:21 PM Details: 22 G needle Medications: 2 mL bupivacaine 0.5 %; 2 mL lidocaine 1 %; 40 mg methylPREDNISolone acetate 40 MG/ML Outcome: tolerated well, no immediate complications Patient was prepped and draped in the usual sterile fashion.       Clinical Data: No additional findings.   Subjective: Chief Complaint  Patient presents with   Left Leg - Pain    HPI patient is a pleasant 61 year old female who comes in today with recurrent left knee pain in addition to new onset left-sided low back pain.  In regards to the knee, history of left knee pain for which she was seen by [place] almost a year ago.  Left knee was injected with cortisone which helped.  All of her symptoms returned about 6 months ago.  The pain starts in her left lower buttock and radiates down the lateral leg and into the knee.  She denies any  pain into the groin or pain distal to the knee itself.  Symptoms are constant but worse going from a seated to standing position.  She has been taking ibuprofen without significant relief.  She denies any paresthesias to the left lower extremity.  No bowel or bladder change or saddle paresthesias.  Review of Systems as detailed in HPI.  All others reviewed and are negative.   Objective: Vital Signs: Ht '5\' 3"'$  (1.6 m)   Wt 230 lb (104.3 kg)   BMI 40.74 kg/m   Physical Exam well-developed well-nourished female in no acute distress.  Alert and oriented x3.  Ortho Exam left knee exam shows a trace effusion.  Range of motion 0 to 100 degrees.  Medial joint line tenderness.  She is neurovascular tact distally.  Lumbar spine exam shows no spinous tenderness.  She does have mild left-sided paraspinous musculature tenderness.  Increased pain with lumbar flexion and extension.  Positive straight leg raise on the left.  Full strength throughout.  She is neurovascular intact distally.  Specialty Comments:  No specialty comments available.  Imaging: XR Lumbar Spine 2-3 Views  Result Date: 12/21/2021 Mild lumbar scoliosis.  Mild degenerative changes L3-4 and L5-S1  XR KNEE 3 VIEW LEFT  Result Date: 12/21/2021 X-rays demonstrate  mild medial patellofemoral compartment degenerative changes    PMFS History: There are no problems to display for this patient.  History reviewed. No pertinent past medical history.  Family History  Problem Relation Age of Onset   Cancer Maternal Grandmother 65       cervical   Diabetes Father    Cancer Mother 30       breast   Breast cancer Mother     Past Surgical History:  Procedure Laterality Date   BREAST BIOPSY Right 2012   Benign US Guided core biopsy   NO PAST SURGERIES     Social History   Occupational History   Not on file  Tobacco Use   Smoking status: Every Day    Packs/day: 0.25    Years: 20.00    Total pack years: 5.00    Types:  Cigarettes   Smokeless tobacco: Never   Tobacco comments:    1-2 cigarettes per day  Vaping Use   Vaping Use: Never used  Substance and Sexual Activity   Alcohol use: Yes    Comment: socially   Drug use: No   Sexual activity: Not Currently    Birth control/protection: None
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
## 2022-03-24 ENCOUNTER — Ambulatory Visit: Payer: Medicaid Other | Admitting: Orthopaedic Surgery

## 2022-03-24 ENCOUNTER — Encounter: Payer: Self-pay | Admitting: Orthopaedic Surgery

## 2022-03-24 ENCOUNTER — Ambulatory Visit (INDEPENDENT_AMBULATORY_CARE_PROVIDER_SITE_OTHER): Payer: Medicaid Other | Admitting: Sports Medicine

## 2022-03-24 ENCOUNTER — Ambulatory Visit: Payer: Self-pay

## 2022-03-24 ENCOUNTER — Ambulatory Visit (INDEPENDENT_AMBULATORY_CARE_PROVIDER_SITE_OTHER): Payer: Medicaid Other

## 2022-03-24 VITALS — Ht 63.0 in | Wt 230.0 lb

## 2022-03-24 DIAGNOSIS — M25552 Pain in left hip: Secondary | ICD-10-CM | POA: Diagnosis not present

## 2022-03-24 DIAGNOSIS — M1612 Unilateral primary osteoarthritis, left hip: Secondary | ICD-10-CM

## 2022-03-24 MED ORDER — LIDOCAINE HCL 1 % IJ SOLN
4.0000 mL | INTRAMUSCULAR | Status: AC | PRN
  Administered 2022-03-24: 4 mL

## 2022-03-24 MED ORDER — METHYLPREDNISOLONE ACETATE 40 MG/ML IJ SUSP
80.0000 mg | INTRAMUSCULAR | Status: AC | PRN
  Administered 2022-03-24: 80 mg via INTRA_ARTICULAR

## 2022-03-24 NOTE — Progress Notes (Signed)
   Procedure Note  Patient: Donna Huffman             Date of Birth: 12-07-1960           MRN: [id number]             Visit Date: 03/24/2022  Procedures: Visit Diagnoses:  1. Pain in left hip    Large Joint Inj: L hip joint on 03/24/2022 10:48 AM Indications: pain Details: 22 G 3.5 in needle, ultrasound-guided anterior approach Medications: 4 mL lidocaine 1 %; 80 mg methylPREDNISolone acetate 40 MG/ML Outcome: tolerated well, no immediate complications  Procedure: US-guided intra-articular hip injection, left After discussion on risks/benefits/indications and informed verbal consent was obtained, a timeout was performed. Patient was lying supine on exam table. The hip was cleaned with betadine and alcohol swabs. Then utilizing ultrasound guidance, the patient's femoral head and neck junction was identified and subsequently injected with 4:2 lidocaine:depomedrol via an in-plane approach with ultrasound visualization of the injectate administered into the hip joint. Patient tolerated procedure well without immediate complications.  Procedure, treatment alternatives, risks and benefits explained, specific risks discussed. Consent was given by the patient. Immediately prior to procedure a time out was called to verify the correct patient, procedure, equipment, support staff and site/side marked as required. Patient was prepped and draped in the usual sterile fashion.     - I evaluated the patient about 10 minutes post-injection and she had some improvement in pain and range of motion - follow-up with Dr.  Erlinda Hong as indicated; I am happy to see them as needed  Elba Barman, DO [facility]  This note was dictated using Dragon naturally speaking software and may contain errors in syntax, spelling, or content which have not been identified prior to signing this note.

## 2022-03-24 NOTE — Progress Notes (Signed)
Office Visit Note   Patient: Donna Huffman           Date of Birth: 06/19/1960           MRN: [id number] Visit Date: 03/24/2022              Requested by: Velna Hatchet, MD [address] [place],  [place] [address] PCP: Velna Hatchet, MD   Assessment & Plan: Visit Diagnoses:  1. Primary osteoarthritis of left hip     Plan: Impression is left hip advanced degenerative joint disease.  Today, we discussed various treatment options to include intra-articular cortisone injection versus total hip arthroplasty.  For now, she will need to continue working on weight loss before proceeding with surgery.  Goal weight would be around 220 pounds.  She would like to proceed with intra-articular hip injection in the meantime.  Referrals been made to Dr. Rolena Infante.  Follow-up as needed.  Follow-Up Instructions: Return if symptoms worsen or fail to improve.   Orders:  Orders Placed This Encounter  Procedures   XR HIP UNILAT W OR W/O PELVIS 2-3 VIEWS LEFT   No orders of the defined types were placed in this encounter.     Procedures: No procedures performed   Clinical Data: No additional findings.   Subjective: Chief Complaint  Patient presents with   Left Hip - Pain    HPI patient is a pleasant 62 year old female who comes in today with left hip pain.  Pain is primarily to the lateral hip but radiates into the buttock and SI joint.  This is constant in nature but worse with walking, sleeping and sitting.  She denies any pain to the groin or anterior thigh.  She has been taking NSAIDs without relief.  No paresthesias to the left lower extremity.  No bowel or bladder incontinence.  She has been to physical therapy for her back which did not help.  Cortisone injection to the left hip.  Review of Systems as detailed in HPI.  All others reviewed and negative.   Objective: Vital Signs: Ht '5\' 3"'$  (1.6 m)   Wt 230 lb (104.3 kg)   BMI 40.74 kg/m   Physical Exam well-nourished female no  acute distress.  Alert and oriented x 3.  Ortho Exam left hip exam reveals marked pain with logroll and FADIR.  She does have slight pain with lumbar flexion and extension.  She is neurovascular intact distally.  Specialty Comments:  No specialty comments available.  Imaging: XR HIP UNILAT W OR W/O PELVIS 2-3 VIEWS LEFT  Result Date: 03/24/2022 X-rays demonstrate advanced degenerative changes to the left hip joint with bone-on-bone changes    PMFS History: There are no problems to display for this patient.  History reviewed. No pertinent past medical history.  Family History  Problem Relation Age of Onset   Cancer Maternal Grandmother 61       cervical   Diabetes Father    Cancer Mother 20       breast   Breast cancer Mother     Past Surgical History:  Procedure Laterality Date   BREAST BIOPSY Right 2012   Benign US Guided core biopsy   NO PAST SURGERIES     Social History   Occupational History   Not on file  Tobacco Use   Smoking status: Every Day    Packs/day: 0.25    Years: 20.00    Total pack years: 5.00    Types: Cigarettes   Smokeless tobacco: Never  Tobacco comments:    1-2 cigarettes per day  Vaping Use   Vaping Use: Never used  Substance and Sexual Activity   Alcohol use: Yes    Comment: socially   Drug use: No   Sexual activity: Not Currently    Birth control/protection: None

## 2022-04-06 ENCOUNTER — Telehealth: Payer: Self-pay | Admitting: Orthopaedic Surgery

## 2022-04-06 NOTE — Telephone Encounter (Signed)
Pt called requesting a call back. Pt states she is due to have hip replacement but has to lose weight and she states she is starting water aerobatics and the [employer] is asking for CPT code for medicaid to pay for sessions. Please call pt about this matter at [phone].

## 2022-04-21 ENCOUNTER — Ambulatory Visit: Payer: Medicaid Other | Admitting: Orthopaedic Surgery

## 2022-04-21 ENCOUNTER — Encounter: Payer: Self-pay | Admitting: Orthopaedic Surgery

## 2022-04-21 VITALS — Ht 63.0 in | Wt 231.0 lb

## 2022-04-21 DIAGNOSIS — M1612 Unilateral primary osteoarthritis, left hip: Secondary | ICD-10-CM | POA: Diagnosis not present

## 2022-04-21 NOTE — Progress Notes (Signed)
Office Visit Note   Patient: Donna Huffman           Date of Birth: 1961-01-19           MRN: [id number] Visit Date: 04/21/2022              Requested by: Velna Hatchet, MD [address] [place],  [place] [address] PCP: Velna Hatchet, MD   Assessment & Plan: Visit Diagnoses:  1. Unilateral primary osteoarthritis, left hip     Plan: Impression is left hip osteoarthritis.  Patient has failed conservative treatment options to include prescription anti-inflammatories as well as cortisone injection.  She is interested in total hip arthroplasty at this time.  Unfortunately, she has a BMI of 40.92.  She will need to lose approximately 10 pounds before we proceed with left total hip replacement.  When she gets to this point, she will follow-up with [place] to schedule surgery.  Call with concerns or questions in the meantime.  Follow-Up Instructions: Return if symptoms worsen or fail to improve.   Orders:  No orders of the defined types were placed in this encounter.  No orders of the defined types were placed in this encounter.     Procedures: No procedures performed   Clinical Data: No additional findings.   Subjective: Chief Complaint  Patient presents with   Left Hip - Follow-up    HPI patient is a pleasant 62 year old female who comes in today with recurrent left hip pain.  History of osteoarthritis.  She continues to have pain to the left groin.  Worse with activity.  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  Left hip was injected with cortisone on 03/25/2027 which provided minimal relief.  At this point, she would like to proceed with total hip arthroplasty.  Review of Systems as detailed in HPI.  All others reviewed and are negative.   Objective: Vital Signs: Ht '5\' 3"'$  (1.6 m)   Wt 231 lb (104.8 kg)   BMI 40.92 kg/m   Physical Exam well-developed well-nourished female no acute distress.  Alert and oriented x 3.  Ortho Exam left hip exam reveals  painful logroll and FADIR testing.  Pain with [place].  She is neurovascular intact distally.  Specialty Comments:  No specialty comments available.  Imaging: No new imaging   PMFS History: There are no problems to display for this patient.  History reviewed. No pertinent past medical history.  Family History  Problem Relation Age of Onset   Cancer Maternal Grandmother 20       cervical   Diabetes Father    Cancer Mother 17       breast   Breast cancer Mother     Past Surgical History:  Procedure Laterality Date   BREAST BIOPSY Right 2012   Benign US Guided core biopsy   NO PAST SURGERIES     Social History   Occupational History   Not on file  Tobacco Use   Smoking status: Every Day    Packs/day: 0.25    Years: 20.00    Total pack years: 5.00    Types: Cigarettes   Smokeless tobacco: Never   Tobacco comments:    1-2 cigarettes per day  Vaping Use   Vaping Use: Never used  Substance and Sexual Activity   Alcohol use: Yes    Comment: socially   Drug use: No   Sexual activity: Not Currently    Birth control/protection: None

## 2022-05-30 DIAGNOSIS — Z0001 Encounter for general adult medical examination with abnormal findings: Secondary | ICD-10-CM | POA: Diagnosis not present

## 2022-05-30 DIAGNOSIS — Z131 Encounter for screening for diabetes mellitus: Secondary | ICD-10-CM | POA: Diagnosis not present

## 2022-05-30 DIAGNOSIS — Z1329 Encounter for screening for other suspected endocrine disorder: Secondary | ICD-10-CM | POA: Diagnosis not present

## 2022-05-30 DIAGNOSIS — M1612 Unilateral primary osteoarthritis, left hip: Secondary | ICD-10-CM | POA: Diagnosis not present

## 2022-05-30 DIAGNOSIS — R635 Abnormal weight gain: Secondary | ICD-10-CM | POA: Diagnosis not present

## 2022-05-30 DIAGNOSIS — Z6841 Body Mass Index (BMI) 40.0 and over, adult: Secondary | ICD-10-CM | POA: Diagnosis not present

## 2022-05-30 DIAGNOSIS — Z136 Encounter for screening for cardiovascular disorders: Secondary | ICD-10-CM | POA: Diagnosis not present

## 2022-06-13 DIAGNOSIS — E782 Mixed hyperlipidemia: Secondary | ICD-10-CM | POA: Diagnosis not present

## 2022-06-13 DIAGNOSIS — Z6841 Body Mass Index (BMI) 40.0 and over, adult: Secondary | ICD-10-CM | POA: Diagnosis not present

## 2022-06-13 DIAGNOSIS — M1612 Unilateral primary osteoarthritis, left hip: Secondary | ICD-10-CM | POA: Diagnosis not present

## 2022-06-13 DIAGNOSIS — R7303 Prediabetes: Secondary | ICD-10-CM | POA: Diagnosis not present

## 2022-06-13 DIAGNOSIS — D751 Secondary polycythemia: Secondary | ICD-10-CM | POA: Diagnosis not present

## 2022-06-17 ENCOUNTER — Encounter: Payer: Self-pay | Admitting: Orthopaedic Surgery

## 2022-06-17 ENCOUNTER — Ambulatory Visit (INDEPENDENT_AMBULATORY_CARE_PROVIDER_SITE_OTHER): Payer: 59 | Admitting: Orthopaedic Surgery

## 2022-06-17 VITALS — Ht 63.0 in | Wt 229.0 lb

## 2022-06-17 DIAGNOSIS — M1612 Unilateral primary osteoarthritis, left hip: Secondary | ICD-10-CM

## 2022-06-17 DIAGNOSIS — Z6841 Body Mass Index (BMI) 40.0 and over, adult: Secondary | ICD-10-CM

## 2022-06-17 NOTE — Progress Notes (Signed)
Office Visit Note   Patient: Donna Huffman           Date of Birth: 14-Dec-1960           MRN: [id number] Visit Date: 06/17/2022              Requested by: Alysia Penna, MD [address] [place],  [place] [address] PCP: Alysia Penna, MD   Assessment & Plan: Visit Diagnoses:  1. Primary osteoarthritis of left hip   2. Body mass index 40.0-44.9, adult [facility])     Plan: 62 year old female with end-stage left hip DJD.  I explained that Visco injections are only approved for the knees.  The patient has about another 10 pounds to lose before she is a surgical candidate for a total hip replacement.  Questions encouraged and answered.  She will follow-up once she has met her goal weight.  Follow-Up Instructions: No follow-ups on file.   Orders:  No orders of the defined types were placed in this encounter.  No orders of the defined types were placed in this encounter.     Procedures: No procedures performed   Clinical Data: No additional findings.   Subjective: Chief Complaint  Patient presents with   Left Hip - Pain    HPI  Patient returns today for recheck on her weight and follow-up on left hip DJD.  Has questions about other treatment options.  Review of Systems  Constitutional: Negative.   HENT: Negative.    Eyes: Negative.   Respiratory: Negative.    Cardiovascular: Negative.   Endocrine: Negative.   Musculoskeletal: Negative.   Neurological: Negative.   Hematological: Negative.   Psychiatric/Behavioral: Negative.    All other systems reviewed and are negative.    Objective: Vital Signs: Ht 5\' 3"  (1.6 m)   Wt 229 lb (103.9 kg)   BMI 40.57 kg/m   Physical Exam Vitals and nursing note reviewed.  Constitutional:      Appearance: She is well-developed.  HENT:     Head: Normocephalic and atraumatic.  Pulmonary:     Effort: Pulmonary effort is normal.  Abdominal:     Palpations: Abdomen is soft.  Musculoskeletal:     Cervical back: Neck  supple.  Skin:    General: Skin is warm.     Capillary Refill: Capillary refill takes less than 2 seconds.  Neurological:     Mental Status: She is alert and oriented to person, place, and time.  Psychiatric:        Behavior: Behavior normal.        Thought Content: Thought content normal.        Judgment: Judgment normal.     Ortho Exam  Examination left hip is unchanged.  Specialty Comments:  No specialty comments available.  Imaging: No results found.   PMFS History: There are no problems to display for this patient.  No past medical history on file.  Family History  Problem Relation Age of Onset   Cancer Maternal Grandmother 98       cervical   Diabetes Father    Cancer Mother 20       breast   Breast cancer Mother     Past Surgical History:  Procedure Laterality Date   BREAST BIOPSY Right 2012   Benign US Guided core biopsy   NO PAST SURGERIES     Social History   Occupational History   Not on file  Tobacco Use   Smoking status: Every Day  Packs/day: 0.25    Years: 20.00    Additional pack years: 0.00    Total pack years: 5.00    Types: Cigarettes   Smokeless tobacco: Never   Tobacco comments:    1-2 cigarettes per day  Vaping Use   Vaping Use: Never used  Substance and Sexual Activity   Alcohol use: Yes    Comment: socially   Drug use: No   Sexual activity: Not Currently    Birth control/protection: None

## 2022-09-20 ENCOUNTER — Encounter: Payer: Self-pay | Admitting: Orthopaedic Surgery

## 2022-09-20 ENCOUNTER — Ambulatory Visit (INDEPENDENT_AMBULATORY_CARE_PROVIDER_SITE_OTHER): Payer: 59 | Admitting: Orthopaedic Surgery

## 2022-09-20 ENCOUNTER — Other Ambulatory Visit (INDEPENDENT_AMBULATORY_CARE_PROVIDER_SITE_OTHER): Payer: 59

## 2022-09-20 VITALS — Ht 63.0 in | Wt 217.0 lb

## 2022-09-20 DIAGNOSIS — M1612 Unilateral primary osteoarthritis, left hip: Secondary | ICD-10-CM

## 2022-09-20 NOTE — Progress Notes (Signed)
   Office Visit Note   Patient: Donna Huffman           Date of Birth: 02-Apr-1960           MRN: [id number] Visit Date: 09/20/2022              Requested by: Alysia Penna, MD [address] [place],  [place] [address] PCP: Alysia Penna, MD   Assessment & Plan: Visit Diagnoses:  1. Primary osteoarthritis of left hip     Plan: Patient is 62 year old female with end-stage left hip DJD with bone-on-bone joint space narrowing.  Patient has now achieved her goal BMI through exercise and dieting.  She would like to move forward with scheduling for left total hip replacement.  New x-rays obtained today.  Risk benefits prognosis discussed again.  Debbie to call patient to confirm surgery time.  Follow-Up Instructions: No follow-ups on file.   Orders:  Orders Placed This Encounter  Procedures   XR HIP UNILAT W OR W/O PELVIS 2-3 VIEWS LEFT   No orders of the defined types were placed in this encounter.     Procedures: No procedures performed   Clinical Data: No additional findings.   Subjective: Chief Complaint  Patient presents with   Left Hip - Follow-up    HPI Patient returns today for recheck of her weight.  She has lost weight from doing water aerobics and dieting.  She has bone-on-bone end-stage left hip DJD.  This continues to cause severe pain and severe limitation with ADLs. Review of Systems   Objective: Vital Signs: Ht 5\' 3"  (1.6 m)   Wt 217 lb (98.4 kg)   BMI 38.44 kg/m   Physical Exam  Ortho Exam Examination left hip shows severe pain with any attempted range of motion of the hip.  Antalgic gait. Specialty Comments:  No specialty comments available.  Imaging: XR HIP UNILAT W OR W/O PELVIS 2-3 VIEWS LEFT  Result Date: 09/20/2022 Advanced degenerative joint disease with bone-on-bone joint space narrowing.  Bilateral femoral varus bowing    PMFS History: There are no problems to display for this patient.  History reviewed. No pertinent past  medical history.  Family History  Problem Relation Age of Onset   Cancer Maternal Grandmother 65       cervical   Diabetes Father    Cancer Mother 31       breast   Breast cancer Mother     Past Surgical History:  Procedure Laterality Date   BREAST BIOPSY Right 2012   Benign US Guided core biopsy   NO PAST SURGERIES     Social History   Occupational History   Not on file  Tobacco Use   Smoking status: Every Day    Current packs/day: 0.25    Average packs/day: 0.3 packs/day for 20.0 years (5.0 ttl pk-yrs)    Types: Cigarettes   Smokeless tobacco: Never   Tobacco comments:    1-2 cigarettes per day  Vaping Use   Vaping status: Never Used  Substance and Sexual Activity   Alcohol use: Yes    Comment: socially   Drug use: No   Sexual activity: Not Currently    Birth control/protection: None

## 2022-11-07 NOTE — Progress Notes (Signed)
Surgical Instructions   Your procedure is scheduled on September 27. Report to [facility] Main Entrance "A" at 07:10 A.M., then check in with the Admitting office. Any questions or running late day of surgery: call [phone]  Questions prior to your surgery date: call [phone], Monday-Friday, 8am-4pm. If you experience any cold or flu symptoms such as cough, fever, chills, shortness of breath, etc. between now and your scheduled surgery, please notify us at the above number.     Remember:  Do not eat after midnight the night before your surgery  You may drink clear liquids until 06:40 the morning of your surgery.   Clear liquids allowed are: Water, Non-Citrus Juices (without pulp), Carbonated Beverages, Clear Tea, Black Coffee Only (NO MILK, CREAM OR POWDERED CREAMER of any kind), and Gatorade.    Take these medicines the morning of surgery with A SIP OF WATER: NONE   One week prior to surgery, STOP taking any Aspirin (unless otherwise instructed by your surgeon) Aleve, Naproxen, Ibuprofen, Motrin, Advil, Goody's, BC's, all herbal medications, fish oil, and non-prescription vitamins.                     Do NOT Smoke (Tobacco/Vaping) for 24 hours prior to your procedure.  If you use a CPAP at night, you may bring your mask/headgear for your overnight stay.   You will be asked to remove any contacts, glasses, piercing's, hearing aid's, dentures/partials prior to surgery. Please bring cases for these items if needed.    Patients discharged the day of surgery will not be allowed to drive home, and someone needs to stay with them for 24 hours.  SURGICAL WAITING ROOM VISITATION Patients may have no more than 2 support people in the waiting area - these visitors may rotate.   Pre-op nurse will coordinate an appropriate time for 1 ADULT support person, who may not rotate, to accompany patient in pre-op.  Children under the age of 55 must have an adult with them who is not the  patient and must remain in the main waiting area with an adult.  If the patient needs to stay at the hospital during part of their recovery, the visitor guidelines for inpatient rooms apply.  Please refer to the [facility] website for the visitor guidelines for any additional information.   If you received a COVID test during your pre-op visit  it is requested that you wear a mask when out in public, stay away from anyone that may not be feeling well and notify your surgeon if you develop symptoms. If you have been in contact with anyone that has tested positive in the last 10 days please notify you surgeon.      Pre-operative 5 CHG Bathing Instructions   You can play a key role in reducing the risk of infection after surgery. Your skin needs to be as free of germs as possible. You can reduce the number of germs on your skin by washing with CHG (chlorhexidine gluconate) soap before surgery. CHG is an antiseptic soap that kills germs and continues to kill germs even after washing.   DO NOT use if you have an allergy to chlorhexidine/CHG or antibacterial soaps. If your skin becomes reddened or irritated, stop using the CHG and notify one of our RNs at [phone].   Please shower with the CHG soap starting 4 days before surgery using the following schedule:     Please keep in mind the following:  DO NOT shave,  including legs and underarms, starting the day of your first shower.   You may shave your face at any point before/day of surgery.  Place clean sheets on your bed the day you start using CHG soap. Use a clean washcloth (not used since being washed) for each shower. DO NOT sleep with pets once you start using the CHG.   CHG Shower Instructions:  If you choose to wash your hair and private area, wash first with your normal shampoo/soap.  After you use shampoo/soap, rinse your hair and body thoroughly to remove shampoo/soap residue.  Turn the water OFF and apply about 3 tablespoons  (45 ml) of CHG soap to a CLEAN washcloth.  Apply CHG soap ONLY FROM YOUR NECK DOWN TO YOUR TOES (washing for 3-5 minutes)  DO NOT use CHG soap on face, private areas, open wounds, or sores.  Pay special attention to the area where your surgery is being performed.  If you are having back surgery, having someone wash your back for you may be helpful. Wait 2 minutes after CHG soap is applied, then you may rinse off the CHG soap.  Pat dry with a clean towel  Put on clean clothes/pajamas   If you choose to wear lotion, please use ONLY the CHG-compatible lotions on the back of this paper.   Additional instructions for the day of surgery: DO NOT APPLY any lotions, deodorants, cologne, or perfumes.   Do not bring valuables to the hospital. [facility] is not responsible for any belongings/valuables. Do not wear nail polish, gel polish, artificial nails, or any other type of covering on natural nails (fingers and toes) Do not wear jewelry or makeup Put on clean/comfortable clothes.  Please brush your teeth.  Ask your nurse before applying any prescription medications to the skin.     CHG Compatible Lotions   Aveeno Moisturizing lotion  Cetaphil Moisturizing Cream  Cetaphil Moisturizing Lotion  Clairol Herbal Essence Moisturizing Lotion, Dry Skin  Clairol Herbal Essence Moisturizing Lotion, Extra Dry Skin  Clairol Herbal Essence Moisturizing Lotion, Normal Skin  Curel Age Defying Therapeutic Moisturizing Lotion with Alpha Hydroxy  Curel Extreme Care Body Lotion  Curel Soothing Hands Moisturizing Hand Lotion  Curel Therapeutic Moisturizing Cream, Fragrance-Free  Curel Therapeutic Moisturizing Lotion, Fragrance-Free  Curel Therapeutic Moisturizing Lotion, Original Formula  Eucerin Daily Replenishing Lotion  Eucerin Dry Skin Therapy Plus Alpha Hydroxy Crme  Eucerin Dry Skin Therapy Plus Alpha Hydroxy Lotion  Eucerin Original Crme  Eucerin Original Lotion  Eucerin Plus Crme Eucerin Plus  Lotion  Eucerin TriLipid Replenishing Lotion  Keri Anti-Bacterial Hand Lotion  Keri Deep Conditioning Original Lotion Dry Skin Formula Softly Scented  Keri Deep Conditioning Original Lotion, Fragrance Free Sensitive Skin Formula  Keri Lotion Fast Absorbing Fragrance Free Sensitive Skin Formula  Keri Lotion Fast Absorbing Softly Scented Dry Skin Formula  Keri Original Lotion  Keri Skin Renewal Lotion Keri Silky Smooth Lotion  Keri Silky Smooth Sensitive Skin Lotion  Nivea Body Creamy Conditioning Oil  Nivea Body Extra Enriched Lotion  Nivea Body Original Lotion  Nivea Body Sheer Moisturizing Lotion Nivea Crme  Nivea Skin Firming Lotion  NutraDerm 30 Skin Lotion  NutraDerm Skin Lotion  NutraDerm Therapeutic Skin Cream  NutraDerm Therapeutic Skin Lotion  ProShield Protective Hand Cream  Provon moisturizing lotion  Please read over the following fact sheets that you were given.

## 2022-11-08 ENCOUNTER — Encounter (HOSPITAL_COMMUNITY): Payer: Self-pay

## 2022-11-08 ENCOUNTER — Encounter (HOSPITAL_COMMUNITY)
Admission: RE | Admit: 2022-11-08 | Discharge: 2022-11-08 | Disposition: A | Discharge status: Home / Self Care | Payer: 59 | Source: Ambulatory Visit | Attending: Orthopaedic Surgery | Admitting: Orthopaedic Surgery

## 2022-11-08 ENCOUNTER — Other Ambulatory Visit: Payer: Self-pay

## 2022-11-08 VITALS — BP 125/86 | HR 51 | Temp 98.2°F | Resp 18 | Ht 63.0 in | Wt 213.8 lb

## 2022-11-08 DIAGNOSIS — Z01818 Encounter for other preprocedural examination: Secondary | ICD-10-CM | POA: Diagnosis present

## 2022-11-08 DIAGNOSIS — Z01812 Encounter for preprocedural laboratory examination: Secondary | ICD-10-CM | POA: Insufficient documentation

## 2022-11-08 LAB — TYPE AND SCREEN
ABO/RH(D): A POS
Antibody Screen: NEGATIVE

## 2022-11-08 LAB — CBC
HCT: 46.4 % — ABNORMAL HIGH (ref 36.0–46.0)
Hemoglobin: 14.4 g/dL (ref 12.0–15.0)
MCH: 29.4 pg (ref 26.0–34.0)
MCHC: 31 g/dL (ref 30.0–36.0)
MCV: 94.7 fL (ref 80.0–100.0)
Platelets: 227 10*3/uL (ref 150–400)
RBC: 4.9 MIL/uL (ref 3.87–5.11)
RDW: 13.2 % (ref 11.5–15.5)
WBC: 7.8 10*3/uL (ref 4.0–10.5)
nRBC: 0 % (ref 0.0–0.2)

## 2022-11-08 LAB — SURGICAL PCR SCREEN
MRSA, PCR: NEGATIVE
Staphylococcus aureus: NEGATIVE

## 2022-11-08 NOTE — Progress Notes (Signed)
RN left message with Mauri Reading at Dr. Warren Danes office that pre-op orders were needed.

## 2022-11-08 NOTE — Progress Notes (Signed)
PCP - Dr. Link Snuffer Cardiologist - Denies  PPM/ICD - Denies   Chest x-ray - 11/25/06 EKG - denies Stress Test - denies ECHO - denies Cardiac Cath -  denies  Sleep Study - N/A CPAP - N/A  Fasting Blood Sugar - N/A - patient denies being diabetic   Blood Thinner Instructions: N//A Aspirin Instructions: N/A  ERAS Protcol - ERAS   COVID TEST- N/A    Anesthesia review: N   Patient denies shortness of breath, fever, cough and chest pain at PAT appointment. Patient denies any respiratory illness / infection in the last 2 months.    All instructions explained to the patient, with a verbal understanding of the material. Patient agrees to go over the instructions while at home for a better understanding. Patient also instructed to self quarantine after being tested for COVID-19. The opportunity to ask questions was provided.

## 2022-11-16 ENCOUNTER — Other Ambulatory Visit: Payer: Self-pay | Admitting: Physician Assistant

## 2022-11-16 MED ORDER — ASPIRIN 81 MG PO TBEC: 81.0000 mg | DELAYED_RELEASE_TABLET | Freq: Two times a day (BID) | ORAL | 0 refills | Status: AC

## 2022-11-16 MED ORDER — ONDANSETRON HCL 4 MG PO TABS: 4.0000 mg | ORAL_TABLET | Freq: Three times a day (TID) | ORAL | 0 refills | Status: AC | PRN

## 2022-11-16 MED ORDER — OXYCODONE-ACETAMINOPHEN 5-325 MG PO TABS
1.0000 | ORAL_TABLET | Freq: Three times a day (TID) | ORAL | 0 refills | Status: DC | PRN
Start: 2022-11-16 — End: 2022-12-12

## 2022-11-16 MED ORDER — METHOCARBAMOL 750 MG PO TABS: 750.0000 mg | ORAL_TABLET | Freq: Two times a day (BID) | ORAL | 2 refills | Status: AC | PRN

## 2022-11-16 MED ORDER — DOCUSATE SODIUM 100 MG PO CAPS: 100.0000 mg | ORAL_CAPSULE | Freq: Every day | ORAL | 2 refills | Status: AC | PRN

## 2022-11-17 DIAGNOSIS — M1611 Unilateral primary osteoarthritis, right hip: Secondary | ICD-10-CM | POA: Insufficient documentation

## 2022-11-17 DIAGNOSIS — M1612 Unilateral primary osteoarthritis, left hip: Secondary | ICD-10-CM | POA: Insufficient documentation

## 2022-11-17 NOTE — Anesthesia Preprocedure Evaluation (Addendum)
Anesthesia Evaluation  Patient identified by MRN, date of birth, ID band  Reviewed: Allergy & Precautions, NPO status , Patient's Chart, lab work & pertinent test results  History of Anesthesia Complications Negative for: history of anesthetic complications  Airway Mallampati: II  TM Distance: >3 FB Neck ROM: Full    Dental  (+) Edentulous Lower, Edentulous Upper   Pulmonary former smoker   Pulmonary exam normal        Cardiovascular negative cardio ROS Normal cardiovascular exam     Neuro/Psych    GI/Hepatic negative GI ROS, Neg liver ROS,,,  Endo/Other  BMI 38  Renal/GU negative Renal ROS     Musculoskeletal   Abdominal   Peds  Hematology negative hematology ROS (+)   Anesthesia Other Findings Day of surgery medications reviewed with patient.  Reproductive/Obstetrics                             Anesthesia Physical Anesthesia Plan  ASA: 2  Anesthesia Plan: Spinal   Post-op Pain Management:  Regional for Post-op pain and Tylenol PO (pre-op)*   Induction:   PONV Risk Score and Plan: 3 and Treatment may vary due to age or medical condition, Ondansetron, Propofol infusion, Dexamethasone and Midazolam  Airway Management Planned: Natural Airway and Simple Face Mask  Additional Equipment: None  Intra-op Plan:   Post-operative Plan:   Informed Consent: I have reviewed the patients History and Physical, chart, labs and discussed the procedure including the risks, benefits and alternatives for the proposed anesthesia with the patient or authorized representative who has indicated his/her understanding and acceptance.       Plan Discussed with: CRNA  Anesthesia Plan Comments:        Anesthesia Quick Evaluation

## 2022-11-18 ENCOUNTER — Ambulatory Visit (HOSPITAL_COMMUNITY): Payer: 59

## 2022-11-18 ENCOUNTER — Ambulatory Visit (HOSPITAL_COMMUNITY): Payer: 59 | Admitting: Anesthesiology

## 2022-11-18 ENCOUNTER — Encounter (HOSPITAL_COMMUNITY)
Admission: RE | Disposition: A | Discharge status: Home / Self Care | Payer: Self-pay | Source: Home / Self Care | Attending: Orthopaedic Surgery

## 2022-11-18 ENCOUNTER — Other Ambulatory Visit: Payer: Self-pay

## 2022-11-18 ENCOUNTER — Observation Stay (HOSPITAL_COMMUNITY)
Admission: RE | Admit: 2022-11-18 | Discharge: 2022-11-21 | Disposition: A | Discharge status: Home / Self Care | Payer: 59 | Attending: Orthopaedic Surgery | Admitting: Orthopaedic Surgery

## 2022-11-18 ENCOUNTER — Observation Stay (HOSPITAL_COMMUNITY): Payer: 59

## 2022-11-18 ENCOUNTER — Encounter (HOSPITAL_COMMUNITY): Payer: Self-pay | Admitting: Orthopaedic Surgery

## 2022-11-18 DIAGNOSIS — M1612 Unilateral primary osteoarthritis, left hip: Secondary | ICD-10-CM | POA: Diagnosis present

## 2022-11-18 DIAGNOSIS — M1611 Unilateral primary osteoarthritis, right hip: Secondary | ICD-10-CM | POA: Diagnosis not present

## 2022-11-18 DIAGNOSIS — Z87891 Personal history of nicotine dependence: Secondary | ICD-10-CM | POA: Insufficient documentation

## 2022-11-18 DIAGNOSIS — Z96642 Presence of left artificial hip joint: Secondary | ICD-10-CM | POA: Diagnosis not present

## 2022-11-18 DIAGNOSIS — Z471 Aftercare following joint replacement surgery: Secondary | ICD-10-CM | POA: Diagnosis not present

## 2022-11-18 HISTORY — PX: TOTAL HIP ARTHROPLASTY: SHX124

## 2022-11-18 LAB — CBC
HCT: 46 % (ref 36.0–46.0)
Hemoglobin: 14.4 g/dL (ref 12.0–15.0)
MCH: 30.1 pg (ref 26.0–34.0)
MCHC: 31.3 g/dL (ref 30.0–36.0)
MCV: 96 fL (ref 80.0–100.0)
Platelets: 222 10*3/uL (ref 150–400)
RBC: 4.79 MIL/uL (ref 3.87–5.11)
RDW: 13 % (ref 11.5–15.5)
WBC: 12.9 10*3/uL — ABNORMAL HIGH (ref 4.0–10.5)
nRBC: 0 % (ref 0.0–0.2)

## 2022-11-18 LAB — ABO/RH: ABO/RH(D): A POS

## 2022-11-18 SURGERY — ARTHROPLASTY, HIP, TOTAL, ANTERIOR APPROACH
Anesthesia: Spinal | Site: Hip | Laterality: Left

## 2022-11-18 MED ORDER — FENTANYL CITRATE (PF) 100 MCG/2ML IJ SOLN
25.0000 ug | INTRAMUSCULAR | Status: DC | PRN
  Administered 2022-11-18: 50 ug via INTRAVENOUS

## 2022-11-18 MED ORDER — MIDAZOLAM HCL 2 MG/2ML IJ SOLN
INTRAMUSCULAR | Status: AC
  Filled 2022-11-18: qty 2

## 2022-11-18 MED ORDER — METHOCARBAMOL 500 MG PO TABS
ORAL_TABLET | ORAL | Status: AC
  Filled 2022-11-18: qty 1

## 2022-11-18 MED ORDER — EPHEDRINE 5 MG/ML INJ
INTRAVENOUS | Status: AC
  Filled 2022-11-18: qty 5

## 2022-11-18 MED ORDER — METHOCARBAMOL 500 MG PO TABS
500.0000 mg | ORAL_TABLET | Freq: Four times a day (QID) | ORAL | Status: DC | PRN
  Administered 2022-11-18 – 2022-11-20 (×6): 500 mg via ORAL
  Filled 2022-11-18 (×5): qty 1

## 2022-11-18 MED ORDER — LACTATED RINGERS IV SOLN: INTRAVENOUS | Status: DC

## 2022-11-18 MED ORDER — ALUM & MAG HYDROXIDE-SIMETH 200-200-20 MG/5ML PO SUSP: 30.0000 mL | ORAL | Status: DC | PRN

## 2022-11-18 MED ORDER — OXYCODONE HCL 5 MG PO TABS
5.0000 mg | ORAL_TABLET | Freq: Once | ORAL | Status: AC | PRN
  Administered 2022-11-18: 5 mg via ORAL

## 2022-11-18 MED ORDER — SODIUM CHLORIDE 0.9 % IR SOLN
Status: DC | PRN
  Administered 2022-11-18: 1000 mL

## 2022-11-18 MED ORDER — MAGNESIUM CITRATE PO SOLN: 1.0000 | Freq: Once | ORAL | Status: DC | PRN

## 2022-11-18 MED ORDER — SORBITOL 70 % SOLN
30.0000 mL | Freq: Every day | Status: DC | PRN
  Administered 2022-11-20: 30 mL via ORAL
  Filled 2022-11-18 (×2): qty 30

## 2022-11-18 MED ORDER — TRANEXAMIC ACID 1000 MG/10ML IV SOLN
2000.0000 mg | INTRAVENOUS | Status: AC
  Filled 2022-11-18: qty 20

## 2022-11-18 MED ORDER — DIPHENHYDRAMINE HCL 12.5 MG/5ML PO ELIX: 25.0000 mg | ORAL_SOLUTION | ORAL | Status: DC | PRN

## 2022-11-18 MED ORDER — PROPOFOL 1000 MG/100ML IV EMUL
INTRAVENOUS | Status: AC
  Filled 2022-11-18: qty 100

## 2022-11-18 MED ORDER — DEXAMETHASONE SODIUM PHOSPHATE 10 MG/ML IJ SOLN
INTRAMUSCULAR | Status: AC
  Filled 2022-11-18: qty 1

## 2022-11-18 MED ORDER — ASPIRIN 81 MG PO CHEW
81.0000 mg | CHEWABLE_TABLET | Freq: Two times a day (BID) | ORAL | Status: DC
  Administered 2022-11-18 – 2022-11-21 (×6): 81 mg via ORAL
  Filled 2022-11-18 (×6): qty 1

## 2022-11-18 MED ORDER — OXYCODONE HCL ER 10 MG PO T12A
10.0000 mg | EXTENDED_RELEASE_TABLET | Freq: Two times a day (BID) | ORAL | Status: DC
  Administered 2022-11-18: 10 mg via ORAL
  Filled 2022-11-18: qty 1

## 2022-11-18 MED ORDER — DEXAMETHASONE SODIUM PHOSPHATE 10 MG/ML IJ SOLN
INTRAMUSCULAR | Status: DC | PRN
  Administered 2022-11-18: 10 mg via INTRAVENOUS

## 2022-11-18 MED ORDER — POVIDONE-IODINE 10 % EX SWAB
2.0000 | Freq: Once | CUTANEOUS | Status: AC
  Administered 2022-11-18: 2 via TOPICAL

## 2022-11-18 MED ORDER — DROPERIDOL 2.5 MG/ML IJ SOLN: 0.6250 mg | Freq: Once | INTRAMUSCULAR | Status: DC | PRN

## 2022-11-18 MED ORDER — LIDOCAINE 2% (20 MG/ML) 5 ML SYRINGE
INTRAMUSCULAR | Status: DC | PRN
  Administered 2022-11-18: 40 mg via INTRAVENOUS

## 2022-11-18 MED ORDER — PANTOPRAZOLE SODIUM 40 MG PO TBEC
40.0000 mg | DELAYED_RELEASE_TABLET | Freq: Every day | ORAL | Status: DC
  Administered 2022-11-18 – 2022-11-21 (×4): 40 mg via ORAL
  Filled 2022-11-18 (×4): qty 1

## 2022-11-18 MED ORDER — OXYCODONE HCL 5 MG/5ML PO SOLN: 5.0000 mg | Freq: Once | ORAL | Status: AC | PRN

## 2022-11-18 MED ORDER — METOCLOPRAMIDE HCL 5 MG PO TABS: 5.0000 mg | ORAL_TABLET | Freq: Three times a day (TID) | ORAL | Status: DC | PRN

## 2022-11-18 MED ORDER — GLYCOPYRROLATE PF 0.2 MG/ML IJ SOSY
PREFILLED_SYRINGE | INTRAMUSCULAR | Status: AC
  Filled 2022-11-18: qty 1

## 2022-11-18 MED ORDER — TRANEXAMIC ACID-NACL 1000-0.7 MG/100ML-% IV SOLN
1000.0000 mg | Freq: Once | INTRAVENOUS | Status: AC
  Administered 2022-11-18: 1000 mg via INTRAVENOUS
  Filled 2022-11-18: qty 100

## 2022-11-18 MED ORDER — METHOCARBAMOL 1000 MG/10ML IJ SOLN: 500.0000 mg | Freq: Four times a day (QID) | INTRAVENOUS | Status: DC | PRN

## 2022-11-18 MED ORDER — PROPOFOL 10 MG/ML IV BOLUS
INTRAVENOUS | Status: DC | PRN
  Administered 2022-11-18: 10 mg via INTRAVENOUS

## 2022-11-18 MED ORDER — BUPIVACAINE-MELOXICAM ER 400-12 MG/14ML IJ SOLN
INTRAMUSCULAR | Status: DC | PRN
  Administered 2022-11-18: 400 mg

## 2022-11-18 MED ORDER — DEXAMETHASONE SODIUM PHOSPHATE 10 MG/ML IJ SOLN
10.0000 mg | Freq: Once | INTRAMUSCULAR | Status: AC
  Administered 2022-11-19: 10 mg via INTRAVENOUS
  Filled 2022-11-18: qty 1

## 2022-11-18 MED ORDER — OXYCODONE HCL ER 10 MG PO T12A
10.0000 mg | EXTENDED_RELEASE_TABLET | Freq: Two times a day (BID) | ORAL | Status: DC
  Administered 2022-11-19 – 2022-11-21 (×6): 10 mg via ORAL
  Filled 2022-11-18 (×6): qty 1

## 2022-11-18 MED ORDER — MIDAZOLAM HCL 2 MG/2ML IJ SOLN
INTRAMUSCULAR | Status: DC | PRN
  Administered 2022-11-18: 2 mg via INTRAVENOUS

## 2022-11-18 MED ORDER — PHENOL 1.4 % MT LIQD: 1.0000 | OROMUCOSAL | Status: DC | PRN

## 2022-11-18 MED ORDER — ACETAMINOPHEN 325 MG PO TABS
325.0000 mg | ORAL_TABLET | Freq: Four times a day (QID) | ORAL | Status: DC | PRN
  Administered 2022-11-19 – 2022-11-20 (×2): 650 mg via ORAL
  Filled 2022-11-18 (×2): qty 2

## 2022-11-18 MED ORDER — VANCOMYCIN HCL 1000 MG IV SOLR
INTRAVENOUS | Status: AC
  Filled 2022-11-18: qty 20

## 2022-11-18 MED ORDER — ONDANSETRON HCL 4 MG/2ML IJ SOLN: 4.0000 mg | Freq: Four times a day (QID) | INTRAMUSCULAR | Status: DC | PRN

## 2022-11-18 MED ORDER — FERROUS SULFATE 325 (65 FE) MG PO TABS
325.0000 mg | ORAL_TABLET | Freq: Three times a day (TID) | ORAL | Status: DC
  Administered 2022-11-18 – 2022-11-21 (×10): 325 mg via ORAL
  Filled 2022-11-18 (×10): qty 1

## 2022-11-18 MED ORDER — FENTANYL CITRATE (PF) 100 MCG/2ML IJ SOLN
INTRAMUSCULAR | Status: AC
  Filled 2022-11-18: qty 2

## 2022-11-18 MED ORDER — OXYCODONE HCL 5 MG PO TABS
ORAL_TABLET | ORAL | Status: AC
  Filled 2022-11-18: qty 1

## 2022-11-18 MED ORDER — SODIUM CHLORIDE 0.9 % IV SOLN: INTRAVENOUS | Status: DC

## 2022-11-18 MED ORDER — CEFAZOLIN SODIUM-DEXTROSE 2-4 GM/100ML-% IV SOLN
2.0000 g | Freq: Four times a day (QID) | INTRAVENOUS | Status: AC
  Administered 2022-11-18 (×2): 2 g via INTRAVENOUS
  Filled 2022-11-18 (×2): qty 100

## 2022-11-18 MED ORDER — PHENYLEPHRINE 80 MCG/ML (10ML) SYRINGE FOR IV PUSH (FOR BLOOD PRESSURE SUPPORT)
PREFILLED_SYRINGE | INTRAVENOUS | Status: DC | PRN
  Administered 2022-11-18: 160 ug via INTRAVENOUS

## 2022-11-18 MED ORDER — POLYETHYLENE GLYCOL 3350 17 G PO PACK
17.0000 g | PACK | Freq: Every day | ORAL | Status: DC
  Administered 2022-11-19 – 2022-11-21 (×3): 17 g via ORAL
  Filled 2022-11-18 (×3): qty 1

## 2022-11-18 MED ORDER — ACETAMINOPHEN 500 MG PO TABS
1000.0000 mg | ORAL_TABLET | Freq: Four times a day (QID) | ORAL | Status: AC
  Administered 2022-11-18 – 2022-11-19 (×4): 1000 mg via ORAL
  Filled 2022-11-18 (×4): qty 2

## 2022-11-18 MED ORDER — METOCLOPRAMIDE HCL 5 MG/ML IJ SOLN: 5.0000 mg | Freq: Three times a day (TID) | INTRAMUSCULAR | Status: DC | PRN

## 2022-11-18 MED ORDER — CEFAZOLIN SODIUM-DEXTROSE 2-4 GM/100ML-% IV SOLN
2.0000 g | INTRAVENOUS | Status: AC
  Administered 2022-11-18: 2 g via INTRAVENOUS
  Filled 2022-11-18: qty 100

## 2022-11-18 MED ORDER — ORAL CARE MOUTH RINSE: 15.0000 mL | Freq: Once | OROMUCOSAL | Status: AC

## 2022-11-18 MED ORDER — PHENYLEPHRINE HCL (PRESSORS) 10 MG/ML IV SOLN
INTRAVENOUS | Status: AC
  Filled 2022-11-18: qty 1

## 2022-11-18 MED ORDER — OXYCODONE HCL 5 MG PO TABS
5.0000 mg | ORAL_TABLET | ORAL | Status: DC | PRN
  Administered 2022-11-18: 5 mg via ORAL
  Administered 2022-11-21: 10 mg via ORAL
  Filled 2022-11-18: qty 2
  Filled 2022-11-18: qty 1

## 2022-11-18 MED ORDER — FENTANYL CITRATE (PF) 250 MCG/5ML IJ SOLN
INTRAMUSCULAR | Status: DC | PRN
  Administered 2022-11-18 (×2): 50 ug via INTRAVENOUS

## 2022-11-18 MED ORDER — 0.9 % SODIUM CHLORIDE (POUR BTL) OPTIME
TOPICAL | Status: DC | PRN
  Administered 2022-11-18: 1000 mL

## 2022-11-18 MED ORDER — DOCUSATE SODIUM 100 MG PO CAPS
100.0000 mg | ORAL_CAPSULE | Freq: Two times a day (BID) | ORAL | Status: DC
  Administered 2022-11-18 – 2022-11-21 (×7): 100 mg via ORAL
  Filled 2022-11-18 (×7): qty 1

## 2022-11-18 MED ORDER — PROPOFOL 500 MG/50ML IV EMUL
INTRAVENOUS | Status: DC | PRN
  Administered 2022-11-18: 50 ug/kg/min via INTRAVENOUS

## 2022-11-18 MED ORDER — PHENYLEPHRINE HCL-NACL 20-0.9 MG/250ML-% IV SOLN
INTRAVENOUS | Status: DC | PRN
  Administered 2022-11-18: 50 ug/min via INTRAVENOUS

## 2022-11-18 MED ORDER — PRONTOSAN WOUND IRRIGATION OPTIME
TOPICAL | Status: DC | PRN
  Administered 2022-11-18: 1 via TOPICAL

## 2022-11-18 MED ORDER — ACETAMINOPHEN 500 MG PO TABS
1000.0000 mg | ORAL_TABLET | Freq: Once | ORAL | Status: AC
  Administered 2022-11-18: 1000 mg via ORAL
  Filled 2022-11-18: qty 2

## 2022-11-18 MED ORDER — OXYCODONE HCL 5 MG PO TABS
10.0000 mg | ORAL_TABLET | ORAL | Status: DC | PRN
  Administered 2022-11-19 – 2022-11-20 (×5): 10 mg via ORAL
  Filled 2022-11-18 (×5): qty 2

## 2022-11-18 MED ORDER — TRANEXAMIC ACID-NACL 1000-0.7 MG/100ML-% IV SOLN
1000.0000 mg | INTRAVENOUS | Status: AC
  Administered 2022-11-18: 1000 mg via INTRAVENOUS
  Filled 2022-11-18: qty 100

## 2022-11-18 MED ORDER — VANCOMYCIN HCL 1 G IV SOLR
INTRAVENOUS | Status: DC | PRN
  Administered 2022-11-18: 1000 mg via TOPICAL

## 2022-11-18 MED ORDER — BUPIVACAINE-MELOXICAM ER 400-12 MG/14ML IJ SOLN
INTRAMUSCULAR | Status: AC
  Filled 2022-11-18: qty 1

## 2022-11-18 MED ORDER — TRANEXAMIC ACID 1000 MG/10ML IV SOLN
INTRAVENOUS | Status: DC | PRN
  Administered 2022-11-18: 2000 mg via TOPICAL

## 2022-11-18 MED ORDER — HYDROMORPHONE HCL 1 MG/ML IJ SOLN
0.5000 mg | INTRAMUSCULAR | Status: DC | PRN
  Administered 2022-11-18: 1 mg via INTRAVENOUS
  Filled 2022-11-18: qty 1

## 2022-11-18 MED ORDER — ONDANSETRON HCL 4 MG/2ML IJ SOLN
INTRAMUSCULAR | Status: AC
  Filled 2022-11-18: qty 2

## 2022-11-18 MED ORDER — GLYCOPYRROLATE PF 0.2 MG/ML IJ SOSY
PREFILLED_SYRINGE | INTRAMUSCULAR | Status: DC | PRN
Start: 2022-11-18 — End: 2022-11-18
  Administered 2022-11-18: .2 mg via INTRAVENOUS

## 2022-11-18 MED ORDER — ONDANSETRON HCL 4 MG/2ML IJ SOLN
INTRAMUSCULAR | Status: DC | PRN
  Administered 2022-11-18: 4 mg via INTRAVENOUS

## 2022-11-18 MED ORDER — CHLORHEXIDINE GLUCONATE 0.12 % MT SOLN
15.0000 mL | Freq: Once | OROMUCOSAL | Status: AC
  Administered 2022-11-18: 15 mL via OROMUCOSAL
  Filled 2022-11-18: qty 15

## 2022-11-18 MED ORDER — ONDANSETRON HCL 4 MG PO TABS: 4.0000 mg | ORAL_TABLET | Freq: Four times a day (QID) | ORAL | Status: DC | PRN

## 2022-11-18 MED ORDER — EPHEDRINE SULFATE-NACL 50-0.9 MG/10ML-% IV SOSY
PREFILLED_SYRINGE | INTRAVENOUS | Status: DC | PRN
  Administered 2022-11-18: 10 mg via INTRAVENOUS

## 2022-11-18 MED ORDER — MENTHOL 3 MG MT LOZG: 1.0000 | LOZENGE | OROMUCOSAL | Status: DC | PRN

## 2022-11-18 SURGICAL SUPPLY — 70 items
ADH SKN CLS APL DERMABOND .7 (GAUZE/BANDAGES/DRESSINGS) ×1
AGENT HMST PWDR HDRLZ BVN CLGN (Miscellaneous) ×1 IMPLANT
BAG COUNTER SPONGE SURGICOUNT (BAG) ×1 IMPLANT
BAG DECANTER FOR FLEXI CONT (MISCELLANEOUS) ×1 IMPLANT
BAG SPNG CNTER NS LX DISP (BAG) ×1
BLADE SAG 18X100X1.27 (BLADE) ×1 IMPLANT
COLLAGEN CELLERATERX 1 GRAM (Miscellaneous) IMPLANT
COOLER ICEMAN CLASSIC (MISCELLANEOUS) IMPLANT
COVER PERINEAL POST (MISCELLANEOUS) ×1 IMPLANT
COVER SURGICAL LIGHT HANDLE (MISCELLANEOUS) ×1 IMPLANT
CUP ACET PINNACLE SECTR 48MM (Joint) IMPLANT
DERMABOND ADVANCED .7 DNX12 (GAUZE/BANDAGES/DRESSINGS) IMPLANT
DRAPE C-ARM 42X72 X-RAY (DRAPES) ×1 IMPLANT
DRAPE POUCH INSTRU U-SHP 10X18 (DRAPES) ×1 IMPLANT
DRAPE STERI IOBAN 125X83 (DRAPES) ×1 IMPLANT
DRAPE U-SHAPE 47X51 STRL (DRAPES) ×2 IMPLANT
DRSG AQUACEL AG ADV 3.5X10 (GAUZE/BANDAGES/DRESSINGS) ×1 IMPLANT
DURAPREP 26ML APPLICATOR (WOUND CARE) ×2 IMPLANT
ELECT BLADE 4.0 EZ CLEAN MEGAD (MISCELLANEOUS) ×1
ELECT REM PT RETURN 9FT ADLT (ELECTROSURGICAL) ×1
ELECTRODE BLDE 4.0 EZ CLN MEGD (MISCELLANEOUS) ×1 IMPLANT
ELECTRODE REM PT RTRN 9FT ADLT (ELECTROSURGICAL) ×1 IMPLANT
GLOVE BIOGEL PI IND STRL 7.0 (GLOVE) ×2 IMPLANT
GLOVE BIOGEL PI IND STRL 7.5 (GLOVE) ×5 IMPLANT
GLOVE ECLIPSE 7.0 STRL STRAW (GLOVE) ×2 IMPLANT
GLOVE SKINSENSE STRL SZ7.5 (GLOVE) ×1 IMPLANT
GLOVE SURG SYN 7.5 E (GLOVE) ×2 IMPLANT
GLOVE SURG SYN 7.5 PF PI (GLOVE) ×2 IMPLANT
GLOVE SURG UNDER POLY LF SZ7 (GLOVE) ×3 IMPLANT
GLOVE SURG UNDER POLY LF SZ7.5 (GLOVE) ×2 IMPLANT
GOWN STRL REUS W/ TWL LRG LVL3 (GOWN DISPOSABLE) IMPLANT
GOWN STRL REUS W/ TWL XL LVL3 (GOWN DISPOSABLE) ×1 IMPLANT
GOWN STRL REUS W/TWL LRG LVL3 (GOWN DISPOSABLE)
GOWN STRL REUS W/TWL XL LVL3 (GOWN DISPOSABLE) ×1
GOWN STRL SURGICAL XL XLNG (GOWN DISPOSABLE) ×1 IMPLANT
GOWN TOGA ZIPPER T7+ PEEL AWAY (MISCELLANEOUS) ×2 IMPLANT
HANDPIECE INTERPULSE COAX TIP (DISPOSABLE) ×1
HEAD FEMORAL 32 CERAMIC (Hips) IMPLANT
HOOD PEEL AWAY T7 (MISCELLANEOUS) ×1 IMPLANT
IV NS IRRIG 3000ML ARTHROMATIC (IV SOLUTION) ×1 IMPLANT
KIT BASIN OR (CUSTOM PROCEDURE TRAY) ×1 IMPLANT
MARKER SKIN DUAL TIP RULER LAB (MISCELLANEOUS) ×1 IMPLANT
NDL SPNL 18GX3.5 QUINCKE PK (NEEDLE) ×1 IMPLANT
NEEDLE SPNL 18GX3.5 QUINCKE PK (NEEDLE) ×1 IMPLANT
PACK TOTAL JOINT (CUSTOM PROCEDURE TRAY) ×1 IMPLANT
PACK UNIVERSAL I (CUSTOM PROCEDURE TRAY) ×1 IMPLANT
PAD COLD SHLDR WRAP-ON (PAD) IMPLANT
PINN ALTRX NEUT ID X OD 32X48 IMPLANT
PINNSECTOR W/GRIP ACE CUP 48MM (Joint) ×1 IMPLANT
SET HNDPC FAN SPRY TIP SCT (DISPOSABLE) ×1 IMPLANT
SOLUTION PRONTOSAN WOUND 350ML (IRRIGATION / IRRIGATOR) ×1 IMPLANT
STAPLER VISISTAT 35W (STAPLE) IMPLANT
STEM FEM ACTIS HIGH SZ2 (Stem) IMPLANT
SUT ETHIBOND 2 V 37 (SUTURE) ×1 IMPLANT
SUT ETHILON 2 0 FS 18 (SUTURE) IMPLANT
SUT VIC AB 0 CT1 27 (SUTURE) ×1
SUT VIC AB 0 CT1 27XBRD ANBCTR (SUTURE) ×1 IMPLANT
SUT VIC AB 1 CTX 36 (SUTURE) ×1
SUT VIC AB 1 CTX36XBRD ANBCTR (SUTURE) ×1 IMPLANT
SUT VIC AB 2-0 CT1 (SUTURE) IMPLANT
SUT VIC AB 2-0 CT1 27 (SUTURE) ×2
SUT VIC AB 2-0 CT1 TAPERPNT 27 (SUTURE) ×2 IMPLANT
SYR 50ML LL SCALE MARK (SYRINGE) ×1 IMPLANT
TOWEL GREEN STERILE (TOWEL DISPOSABLE) ×1 IMPLANT
TOWEL GREEN STERILE FF (TOWEL DISPOSABLE) IMPLANT
TRAY CATH INTERMITTENT SS 16FR (CATHETERS) IMPLANT
TRAY FOLEY W/BAG SLVR 16FR (SET/KITS/TRAYS/PACK)
TRAY FOLEY W/BAG SLVR 16FR ST (SET/KITS/TRAYS/PACK) IMPLANT
TUBE SUCT ARGYLE STRL (TUBING) ×1 IMPLANT
YANKAUER SUCT BULB TIP NO VENT (SUCTIONS) ×1 IMPLANT

## 2022-11-18 NOTE — Plan of Care (Signed)

## 2022-11-18 NOTE — H&P (Signed)
PREOPERATIVE H&P  Chief Complaint: left hip otheoarthritis  HPI: Donna Huffman is a 62 y.o. female who presents for surgical treatment of left hip otheoarthritis.  She denies any changes in medical history.  Past Surgical History:  Procedure Laterality Date   BREAST BIOPSY Right 2012   Benign US Guided core biopsy   NO PAST SURGERIES     Social History   Socioeconomic History   Marital status: Widowed    Spouse name: Not on file   Number of children: 0   Years of education: Not on file   Highest education level: Associate degree: occupational, Scientist, product/process development, or vocational program  Occupational History   Not on file  Tobacco Use   Smoking status: Former    Current packs/day: 0.00    Average packs/day: 0.3 packs/day for 20.0 years (5.0 ttl pk-yrs)    Types: Cigarettes    Quit date: 2023    Years since quitting: 1.7   Smokeless tobacco: Never  Vaping Use   Vaping status: Never Used  Substance and Sexual Activity   Alcohol use: Yes    Comment: socially   Drug use: No   Sexual activity: Not Currently    Birth control/protection: None  Other Topics Concern   Not on file  Social History Narrative   Patient's husband was killed on December 31, 2018 due to drive-by shooting, innocent bystander.    Social Determinants of Health   Financial Resource Strain: Not on file  Food Insecurity: No Food Insecurity (07/30/2019)   Hunger Vital Sign    Worried About Running Out of Food in the Last Year: Never true    Ran Out of Food in the Last Year: Never true  Transportation Needs: No Transportation Needs (07/30/2019)   PRAPARE - Administrator, Civil Service (Medical): No    Lack of Transportation (Non-Medical): No  Physical Activity: Not on file  Stress: Not on file  Social Connections: Unknown (07/06/2021)   Received from [facility]   Social Network    Social Network: Not on file   Family History  Problem Relation Age of Onset   Cancer Maternal  Grandmother 51       cervical   Diabetes Father    Cancer Mother 51       breast   Breast cancer Mother    Allergies  Allergen Reactions   Egg-Derived Products Tinitus    Aching   Shellfish Allergy Tinitus    Muscle aches   Prior to Admission medications   Medication Sig Start Date End Date Taking? Authorizing Provider  aspirin EC 81 MG tablet Take 1 tablet (81 mg total) by mouth 2 (two) times daily. To be taken after surgery to prevent blood clots 11/16/22 11/16/23  Cristie Hem, PA-C  docusate sodium (COLACE) 100 MG capsule Take 1 capsule (100 mg total) by mouth daily as needed. 11/16/22 11/16/23  Cristie Hem, PA-C  methocarbamol (ROBAXIN-750) 750 MG tablet Take 1 tablet (750 mg total) by mouth 2 (two) times daily as needed. 11/16/22   Cristie Hem, PA-C  ondansetron (ZOFRAN) 4 MG tablet Take 1 tablet (4 mg total) by mouth every 8 (eight) hours as needed for nausea or vomiting. 11/16/22   Cristie Hem, PA-C  oxyCODONE-acetaminophen (PERCOCET) 5-325 MG tablet Take 1-2 tablets by mouth every 8 (eight) hours as needed. 11/16/22   Cristie Hem, PA-C  baclofen (LIORESAL) 10 MG tablet Take 1 tablet (10 mg total)  by mouth at bedtime as needed for muscle spasms. Patient not taking: Reported on 11/02/2022 01/19/21   Raspet, Noberto Retort, PA-C  diclofenac (VOLTAREN) 75 MG EC tablet Take 1 tablet (75 mg total) by mouth 2 (two) times daily as needed. Patient not taking: Reported on 11/02/2022 12/21/21   Cristie Hem, PA-C  ibuprofen (ADVIL) 800 MG tablet Take 1 tablet (800 mg total) by mouth 3 (three) times daily. Patient not taking: Reported on 11/02/2022 01/19/21   Raspet, Erin K, PA-C     Positive ROS: All other systems have been reviewed and were otherwise negative with the exception of those mentioned in the HPI and as above.  Physical Exam: General: Alert, no acute distress Cardiovascular: No pedal edema Respiratory: No cyanosis, no use of accessory musculature GI: abdomen  soft Skin: No lesions in the area of chief complaint Neurologic: Sensation intact distally Psychiatric: Patient is competent for consent with normal mood and affect Lymphatic: no lymphedema  MUSCULOSKELETAL: exam stable  Assessment: left hip otheoarthritis  Plan: Plan for Procedure(s): LEFT TOTAL HIP ARTHROPLASTY ANTERIOR APPROACH  The risks benefits and alternatives were discussed with the patient including but not limited to the risks of nonoperative treatment, versus surgical intervention including infection, bleeding, nerve injury,  blood clots, cardiopulmonary complications, morbidity, mortality, among others, and they were willing to proceed.   Glee Arvin, MD 11/18/2022 7:12 AM

## 2022-11-18 NOTE — Evaluation (Signed)
Physical Therapy Evaluation Patient Details Name: Donna Huffman MRN: [id number] DOB: January 14, 1961 Today's Date: 11/18/2022  History of Present Illness  Pt is a 62 y.o. female who presented 11/18/22 for elective L THA anterior approach. No significant PMH.  Clinical Impression  Pt presents with condition above and deficits mentioned below, see PT Problem List. PTA, she was independent without DME, [place] in a 2-level house with 5 STE and x14 stairs to her bedroom. There is a bedroom and bathroom/shower on the main level she can stay in if needed though. She does not have any family, friend, or neighbor assistance available at d/c other than her mother-in-law who will just be providing transportation home from the hospital. Pt states they just recently reconnected in order to assist her with transport for the surgery and that the mother-in-law will likely not assist in other aspects. Currently, pt is demonstrating expected weakness and ROM deficits in her L hip s/p THA. She demonstrates deficits in balance and activity tolerance. She was limited by feeling lightheaded with transitions, but her VSS this date. She was able to perform bed mobility, transfers, and ambulate with a RW with CGA and no LOB this date. As pt does not have support at home, she may be able to benefit from inpatient rehab prior to return home to maximize her independence and safety, especially when performing heavier household chores. However, she is currently mobilizing well overall. Follow physician's recommendations for discharge plan and follow up therapies. Will continue to follow acutely.      If plan is discharge home, recommend the following: A little help with bathing/dressing/bathroom;Assistance with cooking/housework;Assist for transportation;Help with stairs or ramp for entrance   Can travel by private vehicle        Equipment Recommendations Rolling walker (2 wheels);BSC/3in1  Recommendations  for Other Services  OT consult    Functional Status Assessment Patient has had a recent decline in their functional status and demonstrates the ability to make significant improvements in function in a reasonable and predictable amount of time.     Precautions / Restrictions Precautions Precautions: Fall Restrictions Weight Bearing Restrictions: No LLE Weight Bearing: Weight bearing as tolerated Other Position/Activity Restrictions: no hip precautions      Mobility  Bed Mobility Overal bed mobility: Needs Assistance Bed Mobility: Supine to Sit     Supine to sit: Supervision, HOB elevated     General bed mobility comments: Extra time to manage L leg OOB to sit L EOB, supervision for safety as pt reported feeling lightheaded with transitions, VSS    Transfers Overall transfer level: Needs assistance Equipment used: Rolling walker (2 wheels) Transfers: Sit to/from Stand Sit to Stand: Contact guard assist           General transfer comment: CGA for safety, cues for hand placement, slow to rise due to pain    Ambulation/Gait Ambulation/Gait assistance: Contact guard assist Gait Distance (Feet): 90 Feet Assistive device: Rolling walker (2 wheels) Gait Pattern/deviations: Step-through pattern, Decreased step length - right, Decreased step length - left, Decreased stride length, Decreased weight shift to left, Antalgic, Trunk flexed Gait velocity: reduced Gait velocity interpretation: <1.31 ft/sec, indicative of household ambulator   General Gait Details: Slow, antalgic gait pattern with decreased L weight shift due to L hip pain. Cues provided for proximity to RW and to keep RW on ground. No LOB, CGA for safety  Stairs            Wheelchair  Mobility     Tilt Bed    Modified Rankin (Stroke Patients Only)       Balance Overall balance assessment: Needs assistance Sitting-balance support: No upper extremity supported, Feet supported Sitting balance-Leahy  Scale: Good     Standing balance support: Bilateral upper extremity supported, During functional activity, Reliant on assistive device for balance Standing balance-Leahy Scale: Poor Standing balance comment: reliant on RW                             Pertinent Vitals/Pain Pain Assessment Pain Assessment: Faces Faces Pain Scale: Hurts little more Pain Location: L hip Pain Descriptors / Indicators: Discomfort, Grimacing, Operative site guarding Pain Intervention(s): Limited activity within patient's tolerance, Monitored during session, Repositioned    Home Living Family/patient expects to be discharged to:: Private residence Living Arrangements: Alone Available Help at Discharge: Family;Available PRN/intermittently (mother-in-law - can just drive her) Type of Home: House Home Access: Stairs to enter Entrance Stairs-Rails: Left Entrance Stairs-Number of Steps: 5 Alternate Level Stairs-Number of Steps: 14 Home Layout: Two level;Bed/bath upstairs;Able to live on main level with bedroom/bathroom;Full bath on main level Home Equipment: None      Prior Function Prior Level of Function : Independent/Modified Independent;Driving             Mobility Comments: No AD       Extremity/Trunk Assessment   Upper Extremity Assessment Upper Extremity Assessment: Overall WFL for tasks assessed    Lower Extremity Assessment Lower Extremity Assessment: LLE deficits/detail (reports mild numbness in bil legs, likely from meds during surgery today) LLE Deficits / Details: able to achieve at least 90' hip flexion AAROM, limited in hip AROM due to pain though s/p THA; reports mild numbness in bil legs, likely from meds during surgery today; gross MMT scores of >4    Cervical / Trunk Assessment Cervical / Trunk Assessment: Normal  Communication   Communication Communication: No apparent difficulties  Cognition Arousal: Alert Behavior During Therapy: WFL for tasks  assessed/performed Overall Cognitive Status: Within Functional Limits for tasks assessed                                 General Comments: Pt reports feeling "loopy" from pain meds though, intermittent slow processing noted        General Comments General comments (skin integrity, edema, etc.): educated pt to elevate and ice L hip at rest to manage edema, mobilize frequently, and perform HEP (provided handout) frequently, she verbalized understanding; discussed pt staying on main level of house until she recovers; VSS even though pt reporting feeling lightheaded with transitions, guessing due to meds    Exercises Total Joint Exercises Quad Sets: AROM, Left, 5 reps, Supine Heel Slides: AAROM, Left, 5 reps, Supine Hip ABduction/ADduction: AROM, Left, 5 reps, Supine   Assessment/Plan    PT Assessment Patient needs continued PT services  PT Problem List Decreased strength;Decreased range of motion;Decreased balance;Decreased activity tolerance;Decreased mobility;Impaired sensation;Pain       PT Treatment Interventions DME instruction;Gait training;Stair training;Therapeutic activities;Functional mobility training;Therapeutic exercise;Balance training;Neuromuscular re-education;Patient/family education    PT Goals (Current goals can be found in the Care Plan section)  Acute Rehab PT Goals Patient Stated Goal: to improve PT Goal Formulation: With patient Time For Goal Achievement: 12/02/22 Potential to Achieve Goals: Good    Frequency 7X/week     Co-evaluation  AM-PAC PT "6 Clicks" Mobility  Outcome Measure Help needed turning from your back to your side while in a flat bed without using bedrails?: A Little Help needed moving from lying on your back to sitting on the side of a flat bed without using bedrails?: A Little Help needed moving to and from a bed to a chair (including a wheelchair)?: A Little Help needed standing up from a chair using  your arms (e.g., wheelchair or bedside chair)?: A Little Help needed to walk in hospital room?: A Little Help needed climbing 3-5 steps with a railing? : A Little 6 Click Score: 18    End of Session Equipment Utilized During Treatment: Gait belt Activity Tolerance: Patient tolerated treatment well Patient left: in chair;with call bell/phone within reach;with chair alarm set Nurse Communication: Mobility status (NT) PT Visit Diagnosis: Unsteadiness on feet (R26.81);Other abnormalities of gait and mobility (R26.89);Muscle weakness (generalized) (M62.81);Difficulty in walking, not elsewhere classified (R26.2);Pain Pain - Right/Left: Left Pain - part of body: Hip    Time: 1610-9604 PT Time Calculation (min) (ACUTE ONLY): 25 min   Charges:   PT Evaluation $PT Eval Moderate Complexity: 1 Mod PT Treatments $Therapeutic Activity: 8-22 mins PT General Charges $$ ACUTE PT VISIT: 1 Visit         Virgil Benedict, PT, DPT Acute Rehabilitation Services  Office: [phone]   Bettina Gavia 11/18/2022, 4:52 PM

## 2022-11-18 NOTE — Anesthesia Postprocedure Evaluation (Signed)
Anesthesia Post Note  Patient: Donna Huffman  Procedure(s) Performed: LEFT TOTAL HIP ARTHROPLASTY ANTERIOR APPROACH (Left: Hip)     Patient location during evaluation: PACU Anesthesia Type: Spinal Level of consciousness: awake and alert Pain management: pain level controlled Vital Signs Assessment: post-procedure vital signs reviewed and stable Respiratory status: spontaneous breathing, nonlabored ventilation and respiratory function stable Cardiovascular status: blood pressure returned to baseline Postop Assessment: no apparent nausea or vomiting, spinal receding, no headache and no backache Anesthetic complications: no   No notable events documented.  Last Vitals:  Vitals:   11/18/22 1315 11/18/22 1330  BP: (!) 141/87 (!) 149/91  Pulse: (!) 44 (!) 40  Resp: 15 19  Temp:  (!) 36.1 C  SpO2: 95% 95%    Last Pain:  Vitals:   11/18/22 1315  TempSrc:   PainSc: 0-No pain                 Shanda Howells

## 2022-11-18 NOTE — Anesthesia Procedure Notes (Signed)
Spinal  Patient location during procedure: OR Start time: 11/18/2022 9:52 AM End time: 11/18/2022 9:56 AM Reason for block: surgical anesthesia Staffing Performed: anesthesiologist  Anesthesiologist: Kaylyn Layer, MD Performed by: Kaylyn Layer, MD Authorized by: Kaylyn Layer, MD   Preanesthetic Checklist Completed: patient identified, IV checked, risks and benefits discussed, surgical consent, monitors and equipment checked, pre-op evaluation and timeout performed Spinal Block Patient position: sitting Prep: DuraPrep and site prepped and draped Patient monitoring: continuous pulse ox, blood pressure and heart rate Approach: midline Location: L3-4 Injection technique: single-shot Needle Needle type: Pencan  Needle gauge: 24 G Needle length: 9 cm Assessment Events: CSF return Additional Notes Risks, benefits, and alternative discussed. Patient gave consent to procedure. Prepped and draped in sitting position. Patient sedated but responsive to voice. Clear CSF obtained after one needle pass. Positive terminal aspiration. No pain or paraesthesias with injection. Patient tolerated procedure well. Vital signs stable. Amalia Greenhouse, MD

## 2022-11-18 NOTE — Transfer of Care (Signed)
Immediate Anesthesia Transfer of Care Note  Patient: Donna Huffman  Procedure(s) Performed: LEFT TOTAL HIP ARTHROPLASTY ANTERIOR APPROACH (Left: Hip)  Patient Location: PACU  Anesthesia Type:Spinal  Level of Consciousness: awake and alert   Airway & Oxygen Therapy: Patient Spontanous Breathing  Post-op Assessment: Report given to RN and Post -op Vital signs reviewed and stable  Post vital signs: Reviewed and stable  Last Vitals:  Vitals Value Taken Time  BP    Temp    Pulse 61 11/18/22 1148  Resp 19 11/18/22 1148  SpO2 91 % 11/18/22 1148  Vitals shown include unfiled device data.  Last Pain:  Vitals:   11/18/22 0739  TempSrc:   PainSc: 0-No pain         Complications: No notable events documented.

## 2022-11-18 NOTE — Discharge Instructions (Signed)

## 2022-11-18 NOTE — Progress Notes (Addendum)
Lab called and spoke with RN Rushana that BMP was hemolyzed.  Dr. Roda Shutters notified.  Received order to cancel BMP.

## 2022-11-18 NOTE — Op Note (Signed)
LEFT TOTAL HIP ARTHROPLASTY ANTERIOR APPROACH  Procedure Note Donna Huffman   [id number]  Pre-op Diagnosis: left hip osteoarthritis     Post-op Diagnosis: same  Operative Findings Complete loss of joint space and articular cartilage   Operative Procedures  1. Total hip replacement; Left hip; uncemented [id number]   Surgeon: Gershon Mussel, M.D.  Assist: Oneal Grout, PA-C   Anesthesia: spinal  Prosthesis: Depuy Acetabulum: Pinnacle 48 mm Femur: Actis 2 HO Head: 32 mm size: +1 Liner: +4 Bearing Type: metal/poly  Total Hip Arthroplasty (Anterior Approach) Op Note:  After informed consent was obtained and the operative extremity marked in the holding area, the patient was brought back to the operating room and placed supine on the HANA table. Next, the operative extremity was prepped and draped in normal sterile fashion. Surgical timeout occurred verifying patient identification, surgical site, surgical procedure and administration of antibiotics.  A 10 cm longitudinal incision was made starting from 2 fingerbreadths lateral and inferior to the ASIS towards the lateral aspect of the patella.  A Hueter approach to the hip was performed, using the interval between tensor fascia lata and sartorius.  Dissection was carried bluntly down onto the anterior hip capsule. The lateral femoral circumflex vessels were identified and coagulated. A capsulotomy was performed and the capsular flaps tagged for later repair.  The neck osteotomy was performed. The femoral head was removed which showed severe wear, the acetabular rim was cleared of soft tissue and osteophytes and attention was turned to reaming the acetabulum.  Sequential reaming was performed under fluoroscopic guidance down to the floor of the cotyloid fossa. We reamed to a size 47 mm, and then impacted the acetabular shell.  The liner was then placed after irrigation and attention turned to the femur.  After placing the femoral  hook, the leg was taken to externally rotated, extended and adducted position taking care to perform soft tissue releases to allow for adequate mobilization of the femur. Soft tissue was cleared from the shoulder of the greater trochanter and the hook elevator used to improve exposure of the proximal femur. Sequential broaching performed up to a size 2. Trial neck and head were placed. The leg was brought back up to neutral and the construct reduced.  Antibiotic irrigation was placed in the surgical wound.  The position and sizing of components, offset and leg lengths were checked using fluoroscopy. Stability of the construct was checked in extension and external rotation without any subluxation, shuck or impingement of prosthesis. We dislocated the prosthesis, dropped the leg back into position, removed trial components, and irrigated copiously. The final stem and head was then placed, the leg brought back up, the system reduced and fluoroscopy used to verify positioning.  We irrigated, obtained hemostasis and closed the capsule using #2 ethibond suture.  One gram of vancomycin powder was placed in the surgical bed.   One gram of topical tranexamic acid was injected into the joint.  The fascia was closed with #1 vicryl plus, the deep fat layer was closed with 0 vicryl, the subcutaneous layers closed with 2.0 Vicryl Plus and the skin closed with 2.0 nylon and dermabond.  Due to amount of subcutaneous tissue and depth, I decided to use 5 g of cellerate powder to promote healing.  A sterile dressing was applied. The patient was awakened in the operating room and taken to recovery in stable condition.  All sponge, needle, and instrument counts were correct at the end of the case.  Tessa Lerner, my PA, was a medical necessity for opening, closing, limb positioning, retracting, exposing, and overall facilitation and timely completion of the surgery.  Position: supine  Complications: see description of  procedure.  Time Out: performed   Drains/Packing: none  Estimated blood loss: see anesthesia record  Returned to Recovery Room: in good condition.   Antibiotics: yes   Mechanical VTE (DVT) Prophylaxis: sequential compression devices, TED thigh-high  Chemical VTE (DVT) Prophylaxis: aspirin   Fluid Replacement: see anesthesia record  Specimens Removed: 1 to pathology   Sponge and Instrument Count Correct? yes   PACU: portable radiograph - low AP   Plan/RTC: Return in 2 weeks for staple removal. Weight Bearing/Load Lower Extremity: full  Hip precautions: none Suture Removal: 2 weeks   N. Glee Arvin, MD [facility] 11:14 AM   Implant Name Type Inv. Item Serial No. Manufacturer Lot No. LRB No. Used Action  AGENT HMST PWDR HDRLZ BVN CLGN - [id number] Miscellaneous AGENT HMST PWDR HDRLZ BVN CLGN  [employer] [id number] Left 1 Implanted  PINNSECTOR W/GRIP ACE CUP - [id number] Joint PINNSECTOR W/GRIP ACE CUP  [employer] [id number] Left 1 Implanted  PINN ALTRX NEUT ID X OD [id number] - [id number]  PINN ALTRX NEUT ID X OD [id number]  DEPUY ORTHOPA[employer] [id number] Left 1 Implanted  STEM FEM ACTIS HIGH SZ2 - [id number] Stem STEM FEM ACTIS HIGH SZ2  DEPUY ORTHOPA[employer] [id number] Left 1 Implanted  HEAD FEMORAL 32 CERAMIC - [id number] Hips HEAD FEMORAL 32 CERAMIC  DEPU[employer]TH[employer] [id number] Left 1 Implanted

## 2022-11-19 DIAGNOSIS — Z87891 Personal history of nicotine dependence: Secondary | ICD-10-CM | POA: Diagnosis not present

## 2022-11-19 DIAGNOSIS — M1612 Unilateral primary osteoarthritis, left hip: Secondary | ICD-10-CM | POA: Diagnosis not present

## 2022-11-19 NOTE — Plan of Care (Signed)
  Problem: Pain Management: Goal: Pain level will decrease with appropriate interventions Outcome: Progressing   Problem: Nutrition: Goal: Adequate nutrition will be maintained Outcome: Progressing   Problem: Safety: Goal: Ability to remain free from injury will improve Outcome: Progressing   

## 2022-11-19 NOTE — Evaluation (Signed)
Occupational Therapy Evaluation Patient Details Name: Donna Huffman MRN: [id number] DOB: 30-Jan-1961 Today's Date: 11/19/2022   History of Present Illness Pt is a 62 y.o. female who presented 11/18/22 for elective L THA anterior approach. No significant PMH.   Clinical Impression   Pt s/p above diagnosis. Pt c/o 6/10 pain, eager to participate and improve back to baseline. Pt lives alone in 2 story house, able to live on first level. Pt states she will be staying with her mother in law for a few days after, no steps to enter home, one level. Pt currently supervision for mobility, mod I for ADLs, able to transport items using RW, no LOB. Pt displays good strength, ROM, and safety awareness. Pt has no acute OT or follow up needs. Pt would benefit from [facility], shower seat, and RW for home to maximize safety/independence at home.      If plan is discharge home, recommend the following: A little help with walking and/or transfers;A little help with bathing/dressing/bathroom;Assist for transportation;Help with stairs or ramp for entrance    Functional Status Assessment  Patient has had a recent decline in their functional status and demonstrates the ability to make significant improvements in function in a reasonable and predictable amount of time.  Equipment Recommendations  BSC/3in1;Tub/shower seat;Other (comment) (RW)    Recommendations for Other Services       Precautions / Restrictions Precautions Precautions: Fall Restrictions Weight Bearing Restrictions: Yes LLE Weight Bearing: Weight bearing as tolerated Other Position/Activity Restrictions: no hip precautions      Mobility Bed Mobility               General bed mobility comments: arrived in chair    Transfers Overall transfer level: Needs assistance Equipment used: Rolling walker (2 wheels) Transfers: Sit to/from Stand, Bed to chair/wheelchair/BSC Sit to Stand: Supervision           General transfer comment:  supervision for mobility, good balance and safety awareness, able to stand unsupported      Balance Overall balance assessment: Needs assistance Sitting-balance support: No upper extremity supported, Feet supported Sitting balance-Leahy Scale: Normal Sitting balance - Comments: sitting in recliner, ADLs,   Standing balance support: No upper extremity supported, During functional activity Standing balance-Leahy Scale: Good Standing balance comment: able to stand unsupported, no LOB, able to lean/reach unsupported.                           ADL either performed or assessed with clinical judgement   ADL Overall ADL's : Modified independent                                       General ADL Comments: mod I for ADLs, able to gather/transport items, clothes, shoes, using RW, good safety awareness, balance, strength, ROM.     Vision Baseline Vision/History: 1 Wears glasses Ability to See in Adequate Light: 0 Adequate Patient Visual Report: No change from baseline       Perception         Praxis         Pertinent Vitals/Pain Pain Assessment Pain Assessment: 0-10 Pain Score: 1  Pain Location: L hip Pain Descriptors / Indicators: Discomfort, Grimacing, Operative site guarding Pain Intervention(s): Monitored during session     Extremity/Trunk Assessment Upper Extremity Assessment Upper Extremity Assessment: Overall WFL for tasks assessed   Lower Extremity  Assessment Lower Extremity Assessment: Defer to PT evaluation       Communication Communication Communication: No apparent difficulties   Cognition Arousal: Alert Behavior During Therapy: WFL for tasks assessed/performed Overall Cognitive Status: Within Functional Limits for tasks assessed                                       General Comments       Exercises     Shoulder Instructions      Home Living Family/patient expects to be discharged to:: Private  residence Living Arrangements: Alone Available Help at Discharge: Family;Available PRN/intermittently Type of Home: House Home Access: Stairs to enter Entrance Stairs-Number of Steps: 5 Entrance Stairs-Rails: Left Home Layout: Two level;Bed/bath upstairs;Able to live on main level with bedroom/bathroom;Full bath on main level Alternate Level Stairs-Number of Steps: 14 Alternate Level Stairs-Rails: Left Bathroom Shower/Tub: Tub/shower unit (tub downstairs, walk in shower upstairs)   Bathroom Toilet: Standard     Home Equipment: None   Additional Comments: Pt lives alone, tub shower and bedroom downstairs, walk in shower and bedroom/upstairs. Able to stay with mother in law's house  for a few days, no steps to enter, can assist as able      Prior Functioning/Environment Prior Level of Function : Independent/Modified Independent;Driving             Mobility Comments: No AD          OT Problem List: Impaired balance (sitting and/or standing);Pain      OT Treatment/Interventions:      OT Goals(Current goals can be found in the care plan section) Acute Rehab OT Goals Patient Stated Goal: to return home OT Goal Formulation: With patient Time For Goal Achievement: 12/03/22 Potential to Achieve Goals: Good  OT Frequency:      Co-evaluation              AM-PAC OT "6 Clicks" Daily Activity     Outcome Measure Help from another person eating meals?: None Help from another person taking care of personal grooming?: None Help from another person toileting, which includes using toliet, bedpan, or urinal?: A Little Help from another person bathing (including washing, rinsing, drying)?: A Little Help from another person to put on and taking off regular upper body clothing?: None Help from another person to put on and taking off regular lower body clothing?: None 6 Click Score: 22   End of Session Equipment Utilized During Treatment: Gait belt;Rolling walker (2  wheels) Nurse Communication: Mobility status  Activity Tolerance: Patient tolerated treatment well Patient left: in chair;with call bell/phone within reach  OT Visit Diagnosis: Other abnormalities of gait and mobility (R26.89);Pain Pain - Right/Left: Left Pain - part of body: Hip                Time: 1206-1226 OT Time Calculation (min): 20 min Charges:  OT General Charges $OT Visit: 1 Visit OT Evaluation $OT Eval Low Complexity: 1 Low  [address] OTR/L   Alexis Goodell 11/19/2022, 12:28 PM

## 2022-11-19 NOTE — Progress Notes (Signed)
Physical Therapy Treatment Patient Details Name: Donna Huffman MRN: [id number] DOB: 02-27-1960 Today's Date: 11/19/2022   History of Present Illness Pt is a 62 y.o. female who presented 11/18/22 for elective L THA anterior approach. No significant PMH.    PT Comments  Pt received sitting in the recliner and agreeable to session. Pt requesting to use the bathroom at the beginning of the session and is able to perform pericare and hand hygiene without assist. Pt demonstrating heavy BUE reliance to offload LLE initially, however quickly improves stability and step-through pattern. Discussed HEP with pt verbalizing understanding. Pt reports that she has been sleeping in the recliner here, however plans to sleep in a bed at discharge. Pt would benefit from bed mobility practice and additional stair trial before discharge. Pt continues to benefit from PT services to progress toward functional mobility goals.     If plan is discharge home, recommend the following: A little help with bathing/dressing/bathroom;Assistance with cooking/housework;Assist for transportation;Help with stairs or ramp for entrance   Can travel by private vehicle        Equipment Recommendations  Rolling walker (2 wheels);BSC/3in1    Recommendations for Other Services       Precautions / Restrictions Precautions Precautions: Fall Restrictions Weight Bearing Restrictions: Yes LLE Weight Bearing: Weight bearing as tolerated Other Position/Activity Restrictions: no hip precautions     Mobility  Bed Mobility               General bed mobility comments: Pt beginning and ending session in recliner    Transfers Overall transfer level: Needs assistance Equipment used: Rolling walker (2 wheels) Transfers: Sit to/from Stand Sit to Stand: Modified independent (Device/Increase time)           General transfer comment: from recliner and low toilet    Ambulation/Gait Ambulation/Gait assistance:  Supervision Gait Distance (Feet): 140 Feet (+10) Assistive device: Rolling walker (2 wheels) Gait Pattern/deviations: Step-through pattern, Decreased stride length, Decreased weight shift to left, Antalgic, Trunk flexed, Decreased stance time - left       General Gait Details: Pt significantly improving stability and step-through pattern with progressed distance. supervision for safety and cues for RW proximity      Balance Overall balance assessment: Needs assistance Sitting-balance support: No upper extremity supported, Feet supported Sitting balance-Leahy Scale: Normal Sitting balance - Comments: sitting in recliner   Standing balance support: During functional activity, Bilateral upper extremity supported, Reliant on assistive device for balance Standing balance-Leahy Scale: Fair Standing balance comment: with RW support                            Cognition Arousal: Alert Behavior During Therapy: WFL for tasks assessed/performed Overall Cognitive Status: Within Functional Limits for tasks assessed                                          Exercises      General Comments        Pertinent Vitals/Pain Pain Assessment Pain Assessment: Faces Faces Pain Scale: Hurts little more Pain Location: L hip Pain Descriptors / Indicators: Discomfort, Grimacing, Operative site guarding Pain Intervention(s): Monitored during session, Repositioned     PT Goals (current goals can now be found in the care plan section) Acute Rehab PT Goals Patient Stated Goal: to improve PT Goal Formulation: With patient Time For  Goal Achievement: 12/02/22 Progress towards PT goals: Progressing toward goals    Frequency    7X/week       AM-PAC PT "6 Clicks" Mobility   Outcome Measure  Help needed turning from your back to your side while in a flat bed without using bedrails?: A Little Help needed moving from lying on your back to sitting on the side of a flat  bed without using bedrails?: A Little Help needed moving to and from a bed to a chair (including a wheelchair)?: A Little Help needed standing up from a chair using your arms (e.g., wheelchair or bedside chair)?: A Little Help needed to walk in hospital room?: A Little Help needed climbing 3-5 steps with a railing? : A Little 6 Click Score: 18    End of Session Equipment Utilized During Treatment: Gait belt Activity Tolerance: Patient tolerated treatment well Patient left: in chair;with call bell/phone within reach Nurse Communication: Mobility status PT Visit Diagnosis: Unsteadiness on feet (R26.81);Other abnormalities of gait and mobility (R26.89);Muscle weakness (generalized) (M62.81);Difficulty in walking, not elsewhere classified (R26.2);Pain Pain - Right/Left: Left Pain - part of body: Hip     Time: 9390-3009 PT Time Calculation (min) (ACUTE ONLY): 16 min  Charges:    $Gait Training: 8-22 mins PT General Charges $$ ACUTE PT VISIT: 1 Visit                     Johny Shock, PTA Acute Rehabilitation Services Secure Chat Preferred  [phone]    Johny Shock 11/19/2022, 3:49 PM

## 2022-11-19 NOTE — TOC Progression Note (Signed)
Transition of Care [facility]) - Progression Note    Patient Details  Name: Donna Huffman MRN: [id number] Date of Birth: 10-20-1960  [facility] CM/SW Contact  Ronny Bacon, RN Phone Number: 11/19/2022, 4:42 PM  Clinical Narrative:   patient has orders for [facility] PT. Attempt made to arrange services through [place], [facility] and [facility]. Patient denied acceptance due to insurance coverage is not in network.         Expected Discharge Plan and Services                                               Social Determinants of Health (SDOH) Interventions SDOH Screenings   Food Insecurity: No Food Insecurity (11/18/2022)  Housing: Low Risk  (11/18/2022)  Transportation Needs: No Transportation Needs (11/18/2022)  Utilities: Not At Risk (11/18/2022)  Depression (PHQ2-9): Low Risk  (07/30/2019)  Social Connections: Unknown (07/06/2021)   Received from [facility], [facility]  Tobacco Use: Medium Risk (11/18/2022)    Readmission Risk Interventions     No data to display

## 2022-11-19 NOTE — Progress Notes (Signed)
Physical Therapy Treatment Patient Details Name: Donna Huffman MRN: [id number] DOB: 1960-10-09 Today's Date: 11/19/2022   History of Present Illness Pt is a 62 y.o. female who presented 11/18/22 for elective L THA anterior approach. No significant PMH.    PT Comments  Pt received sitting in the recliner and agreeable to session. Pt able to progress gait and stair trials this session with up to CGA for safety. Pt initially demonstrating very antalgic gait due to L hip pain and stiffness, however improves with increased distance. Education provided on importance of frequent mobility to prevent stiffness. Pt instructed in stair technique and is able to demonstrate with CGA due to mild instability. Pt continues to be limited by pain and fatigue. Pt continues to benefit from PT services to progress toward functional mobility goals.    If plan is discharge home, recommend the following: A little help with bathing/dressing/bathroom;Assistance with cooking/housework;Assist for transportation;Help with stairs or ramp for entrance   Can travel by private vehicle        Equipment Recommendations  Rolling walker (2 wheels);BSC/3in1    Recommendations for Other Services       Precautions / Restrictions Precautions Precautions: Fall Restrictions Weight Bearing Restrictions: Yes LLE Weight Bearing: Weight bearing as tolerated Other Position/Activity Restrictions: no hip precautions     Mobility  Bed Mobility               General bed mobility comments: Pt beginning and ending session in recliner    Transfers Overall transfer level: Needs assistance Equipment used: Rolling walker (2 wheels) Transfers: Sit to/from Stand Sit to Stand: Supervision           General transfer comment: from recliner    Ambulation/Gait Ambulation/Gait assistance: Contact guard assist, Supervision Gait Distance (Feet): 140 Feet Assistive device: Rolling walker (2 wheels) Gait Pattern/deviations:  Step-through pattern, Decreased stride length, Decreased weight shift to left, Antalgic, Trunk flexed, Decreased stance time - left       General Gait Details: Slow step-through pattern progressing to step-through with increased distance. CGA progressing to supervision and cues for upright posture   Stairs Stairs: Yes Stairs assistance: Contact guard assist Stair Management: One rail Left Number of Stairs: 3 General stair comments: L rail and R HHA with pt able to complete with CGA and cues for sequencing. Pt demonstrating L lateral lean to elevate RLE to next step with increased difficulty due to L hip pain. No LOB or buckling noted      Balance Overall balance assessment: Needs assistance Sitting-balance support: No upper extremity supported, Feet supported Sitting balance-Leahy Scale: Normal Sitting balance - Comments: sitting in recliner   Standing balance support: During functional activity, Bilateral upper extremity supported, Reliant on assistive device for balance Standing balance-Leahy Scale: Fair Standing balance comment: reliant on RW for ambulation                            Cognition Arousal: Alert Behavior During Therapy: WFL for tasks assessed/performed Overall Cognitive Status: Within Functional Limits for tasks assessed                                          Exercises      General Comments        Pertinent Vitals/Pain Pain Assessment Pain Assessment: Faces Faces Pain Scale: Hurts little more Pain Location:  L hip Pain Descriptors / Indicators: Discomfort, Grimacing, Operative site guarding Pain Intervention(s): Monitored during session, Repositioned     PT Goals (current goals can now be found in the care plan section) Acute Rehab PT Goals Patient Stated Goal: to improve PT Goal Formulation: With patient Time For Goal Achievement: 12/02/22 Progress towards PT goals: Progressing toward goals    Frequency     7X/week       AM-PAC PT "6 Clicks" Mobility   Outcome Measure  Help needed turning from your back to your side while in a flat bed without using bedrails?: A Little Help needed moving from lying on your back to sitting on the side of a flat bed without using bedrails?: A Little Help needed moving to and from a bed to a chair (including a wheelchair)?: A Little Help needed standing up from a chair using your arms (e.g., wheelchair or bedside chair)?: A Little Help needed to walk in hospital room?: A Little Help needed climbing 3-5 steps with a railing? : A Little 6 Click Score: 18    End of Session Equipment Utilized During Treatment: Gait belt Activity Tolerance: Patient tolerated treatment well Patient left: in chair;with call bell/phone within reach Nurse Communication: Mobility status PT Visit Diagnosis: Unsteadiness on feet (R26.81);Other abnormalities of gait and mobility (R26.89);Muscle weakness (generalized) (M62.81);Difficulty in walking, not elsewhere classified (R26.2);Pain Pain - Right/Left: Left Pain - part of body: Hip     Time: 4132-4401 PT Time Calculation (min) (ACUTE ONLY): 19 min  Charges:    $Gait Training: 8-22 mins PT General Charges $$ ACUTE PT VISIT: 1 Visit                     Johny Shock, PTA Acute Rehabilitation Services Secure Chat Preferred  [phone]    Johny Shock 11/19/2022, 2:15 PM

## 2022-11-19 NOTE — Progress Notes (Signed)
   Subjective:  Patient reports pain as mild.  No events. Did well with PT.  Objective:   VITALS:   Vitals:   11/18/22 1956 11/18/22 2236 11/19/22 0358 11/19/22 0733  BP: 116/78 (!) 137/105 111/80 98/73  Pulse: 64 61 (!) 52 (!) 55  Resp: 16 16 20 16   Temp: 98 F (36.7 C) 98 F (36.7 C) 97.9 F (36.6 C) 98.1 F (36.7 C)  TempSrc: Oral Oral Oral Oral  SpO2: 99% 98% 99% 100%  Weight:      Height:        Sensation intact distally Intact pulses distally Dorsiflexion/Plantar flexion intact Incision: dressing C/D/I and no drainage   Lab Results  Component Value Date   WBC 12.9 (H) 11/18/2022   HGB 14.4 11/18/2022   HCT 46.0 11/18/2022   MCV 96.0 11/18/2022   PLT 222 11/18/2022     Assessment/Plan:  1 Day Post-Op   - Expected postop acute blood loss anemia - Up with PT/OT - DVT ppx - SCDs, ambulation, aspirin 81 mg BID - WBAT operative extremity - Pain controlled - Discharge planning - will plan for d/c home tomorrow in order to get more PT.  Will go home with mother in law for a brief period of time.    Glee Arvin 11/19/2022, 10:03 AM

## 2022-11-20 DIAGNOSIS — M1612 Unilateral primary osteoarthritis, left hip: Secondary | ICD-10-CM | POA: Diagnosis not present

## 2022-11-20 DIAGNOSIS — Z87891 Personal history of nicotine dependence: Secondary | ICD-10-CM | POA: Diagnosis not present

## 2022-11-20 NOTE — Plan of Care (Signed)
  Problem: Pain Management: Goal: Pain level will decrease with appropriate interventions Outcome: Progressing   Problem: Nutrition: Goal: Adequate nutrition will be maintained Outcome: Progressing   Problem: Coping: Goal: Level of anxiety will decrease Outcome: Progressing   Problem: Safety: Goal: Ability to remain free from injury will improve Outcome: Progressing

## 2022-11-20 NOTE — Plan of Care (Signed)
  Problem: Safety: Goal: Ability to remain free from injury will improve Outcome: Not Progressing   Problem: Pain Managment: Goal: General experience of comfort will improve Outcome: Not Progressing   Problem: Activity: Goal: Risk for activity intolerance will decrease Outcome: Not Progressing

## 2022-11-20 NOTE — Progress Notes (Signed)
   Subjective:  Patient doing well this morning, sitting upright in chair, pain is controlled. Did well with PT.  Objective:   VITALS:   Vitals:   11/19/22 1554 11/19/22 2107 11/20/22 0636 11/20/22 0914  BP: 111/78 124/73 (!) 140/89 125/83  Pulse: (!) 58 (!) 57 (!) 59 60  Resp: 16   18  Temp: 98 F (36.7 C) 97.7 F (36.5 C) 98.1 F (36.7 C) 98.5 F (36.9 C)  TempSrc: Oral Oral Oral Oral  SpO2: 98% 100% 100% 99%  Weight:      Height:        Sensation intact distally Intact pulses distally Dorsiflexion/Plantar flexion intact Incision: dressing C/D/I and no drainage   Lab Results  Component Value Date   WBC 12.9 (H) 11/18/2022   HGB 14.4 11/18/2022   HCT 46.0 11/18/2022   MCV 96.0 11/18/2022   PLT 222 11/18/2022     Assessment/Plan:  3 days postop left THA  - Expected postop acute blood loss anemia - Up with PT/OT - DVT ppx - SCDs, ambulation, aspirin 81 mg BID - WBAT operative extremity - Pain controlled - Discharge planning-she is requesting discharge tomorrow  Tori Dattilio 11/20/2022, 10:54 AM

## 2022-11-20 NOTE — Progress Notes (Signed)
Physical Therapy Treatment Patient Details Name: Donna Huffman MRN: [id number] DOB: [birth year] Today's Date: 11/20/2022   History of Present Illness Pt is a 62 y.o. female who presented 11/18/22 for elective L THA anterior approach. No significant PMH.    PT Comments  Pt seen for continued mobility progress and training on bed mobility (pt has been sleeping primarily in recliner during admission). The pt was able to complete with light minA to manage LLE, but adopted strategies well for managing LLE herself to assist her leg into and out of the bed. The pt was then guided through seated hip and knee exercises prior to completing hallway ambulation. Will plan to see again later this afternoon for further stair training. Anticipate she will continue to progress well and be safe to return home despite little physical assist available from family.     If plan is discharge home, recommend the following: A little help with bathing/dressing/bathroom;Assistance with cooking/housework;Assist for transportation;Help with stairs or ramp for entrance   Can travel by private vehicle        Equipment Recommendations  Rolling walker (2 wheels);BSC/3in1    Recommendations for Other Services       Precautions / Restrictions Precautions Precautions: Fall Restrictions Weight Bearing Restrictions: Yes LLE Weight Bearing: Weight bearing as tolerated Other Position/Activity Restrictions: no hip precautions     Mobility  Bed Mobility Overal bed mobility: Needs Assistance Bed Mobility: Supine to Sit, Sit to Supine     Supine to sit: Min assist, Supervision Sit to supine: Min assist, Supervision   General bed mobility comments: minA to manage LLE at times but pt largely able to complete without assistance. pt given strategies for assisting with movement of LLE and was able to complete without assistance    Transfers Overall transfer level: Needs assistance Equipment used: Rolling walker (2  wheels) Transfers: Sit to/from Stand, Bed to chair/wheelchair/BSC Sit to Stand: Modified independent (Device/Increase time)   Step pivot transfers: Modified independent (Device/Increase time)       General transfer comment: from recliner and EOB    Ambulation/Gait Ambulation/Gait assistance: Supervision Gait Distance (Feet): 125 Feet Assistive device: Rolling walker (2 wheels) Gait Pattern/deviations: Step-through pattern, Decreased stride length, Decreased weight shift to left, Antalgic, Trunk flexed, Decreased stance time - left Gait velocity: decreased Gait velocity interpretation: <1.31 ft/sec, indicative of household ambulator   General Gait Details: cues for RW position and maintaining UE relaxed. Pt with good stability and no need for physical assistance     Balance Overall balance assessment: Needs assistance Sitting-balance support: No upper extremity supported, Feet supported Sitting balance-Leahy Scale: Normal     Standing balance support: During functional activity, Bilateral upper extremity supported, Reliant on assistive device for balance Standing balance-Leahy Scale: Fair Standing balance comment: can take short steps without UE support, prefers UE support for stability and pain control                            Cognition Arousal: Alert Behavior During Therapy: WFL for tasks assessed/performed Overall Cognitive Status: Within Functional Limits for tasks assessed                                          Exercises Total Joint Exercises Ankle Circles/Pumps: AROM, Both, 10 reps, Seated Long Arc Quad: AROM, Both, 10 reps, Seated Marching in Standing: AROM,  Both, 10 reps, Seated    General Comments General comments (skin integrity, edema, etc.): educated pt to elevate and ice L hip at rest to manage edema, mobilize frequently, and perform HEP (provided handout) frequently, she verbalized understanding;      Pertinent  Vitals/Pain Pain Assessment Pain Assessment: 0-10 Pain Score: 6  Faces Pain Scale: Hurts little more Pain Location: L hip Pain Descriptors / Indicators: Discomfort, Grimacing, Operative site guarding, Sore Pain Intervention(s): Limited activity within patient's tolerance, Monitored during session, Repositioned, RN gave pain meds during session     PT Goals (current goals can now be found in the care plan section) Acute Rehab PT Goals Patient Stated Goal: to return to water aerobics PT Goal Formulation: With patient Time For Goal Achievement: 12/02/22 Potential to Achieve Goals: Good Progress towards PT goals: Progressing toward goals    Frequency    7X/week          AM-PAC PT "6 Clicks" Mobility   Outcome Measure  Help needed turning from your back to your side while in a flat bed without using bedrails?: A Little Help needed moving from lying on your back to sitting on the side of a flat bed without using bedrails?: A Little Help needed moving to and from a bed to a chair (including a wheelchair)?: A Little Help needed standing up from a chair using your arms (e.g., wheelchair or bedside chair)?: A Little Help needed to walk in hospital room?: A Little Help needed climbing 3-5 steps with a railing? : A Little 6 Click Score: 18    End of Session Equipment Utilized During Treatment: Gait belt Activity Tolerance: Patient tolerated treatment well Patient left: in chair;with call bell/phone within reach Nurse Communication: Mobility status PT Visit Diagnosis: Unsteadiness on feet (R26.81);Other abnormalities of gait and mobility (R26.89);Muscle weakness (generalized) (M62.81);Difficulty in walking, not elsewhere classified (R26.2);Pain Pain - Right/Left: Left Pain - part of body: Hip     Time: 2536-6440 PT Time Calculation (min) (ACUTE ONLY): 13 min  Charges:    $Therapeutic Exercise: 8-22 mins PT General Charges $$ ACUTE PT VISIT: 1 Visit                      Vickki Muff, PT, DPT   Acute Rehabilitation Department Office [phone] Secure Chat Communication Preferred   Ronnie Derby 11/20/2022, 8:50 AM

## 2022-11-20 NOTE — Progress Notes (Signed)
Physical Therapy Treatment Patient Details Name: Donna Huffman MRN: [id number] DOB: 06-Sep-1960 Today's Date: 11/20/2022   History of Present Illness Pt is a 62 y.o. female who presented 11/18/22 for elective L THA anterior approach. No significant PMH.    PT Comments  The pt was agreeable to session with focus on stair training this afternoon. She was able to progress ambulation distance well with supervision for safety with use of RW for pain management, but had no LOB or need for physical assistance. She continues to ambulate with antalgic gait and limited wt acceptance on LLE at this time. She was educated in sideways technique for stair management which enables pt to complete stairs with BUE support on rail, completed 4 steps with CGA but no physical assist. Pt continues making good progress towards d/c home.     If plan is discharge home, recommend the following: A little help with bathing/dressing/bathroom;Assistance with cooking/housework;Assist for transportation;Help with stairs or ramp for entrance   Can travel by private vehicle        Equipment Recommendations  Rolling walker (2 wheels);BSC/3in1    Recommendations for Other Services       Precautions / Restrictions Precautions Precautions: Fall Restrictions Weight Bearing Restrictions: Yes LLE Weight Bearing: Weight bearing as tolerated Other Position/Activity Restrictions: no hip precautions     Mobility  Bed Mobility Overal bed mobility: Needs Assistance Bed Mobility: Supine to Sit, Sit to Supine     Supine to sit: Supervision Sit to supine: Supervision   General bed mobility comments: supervision, HOB was elevated slightly    Transfers Overall transfer level: Needs assistance Equipment used: Rolling walker (2 wheels) Transfers: Sit to/from Stand, Bed to chair/wheelchair/BSC Sit to Stand: Modified independent (Device/Increase time)   Step pivot transfers: Modified independent (Device/Increase time)        General transfer comment: from recliner and EOB    Ambulation/Gait Ambulation/Gait assistance: Supervision Gait Distance (Feet): 250 Feet Assistive device: Rolling walker (2 wheels) Gait Pattern/deviations: Step-through pattern, Decreased stride length, Decreased weight shift to left, Antalgic, Trunk flexed, Decreased stance time - left Gait velocity: 0.25 m/s     General Gait Details: cues for RW position and maintaining UE relaxed. Pt with good stability and no need for physical assistance   Stairs Stairs: Yes Stairs assistance: Contact guard assist Stair Management: One rail Left, Sideways, Step to pattern Number of Stairs: 4 General stair comments: L rail with BUE and sideways technique with RLE leading      Balance Overall balance assessment: Needs assistance Sitting-balance support: No upper extremity supported, Feet supported Sitting balance-Leahy Scale: Normal     Standing balance support: During functional activity, Bilateral upper extremity supported, Reliant on assistive device for balance Standing balance-Leahy Scale: Fair Standing balance comment: can take short steps without UE support, prefers UE support for stability and pain control                            Cognition Arousal: Alert Behavior During Therapy: WFL for tasks assessed/performed Overall Cognitive Status: Within Functional Limits for tasks assessed                                          Exercises      General Comments General comments (skin integrity, edema, etc.): VSS on RA, continued education on progressive walking rpogram  Pertinent Vitals/Pain Pain Assessment Pain Assessment: Faces Faces Pain Scale: Hurts even more Pain Location: L hip Pain Descriptors / Indicators: Discomfort, Grimacing, Operative site guarding, Sore Pain Intervention(s): Limited activity within patient's tolerance, Monitored during session, Repositioned     PT Goals  (current goals can now be found in the care plan section) Acute Rehab PT Goals Patient Stated Goal: to return to water aerobics PT Goal Formulation: With patient Time For Goal Achievement: 12/02/22 Potential to Achieve Goals: Good Progress towards PT goals: Progressing toward goals    Frequency    7X/week       AM-PAC PT "6 Clicks" Mobility   Outcome Measure  Help needed turning from your back to your side while in a flat bed without using bedrails?: None Help needed moving from lying on your back to sitting on the side of a flat bed without using bedrails?: None Help needed moving to and from a bed to a chair (including a wheelchair)?: None Help needed standing up from a chair using your arms (e.g., wheelchair or bedside chair)?: None Help needed to walk in hospital room?: A Little Help needed climbing 3-5 steps with a railing? : A Little 6 Click Score: 22    End of Session Equipment Utilized During Treatment: Gait belt Activity Tolerance: Patient tolerated treatment well Patient left: with call bell/phone within reach;in bed Nurse Communication: Mobility status PT Visit Diagnosis: Unsteadiness on feet (R26.81);Other abnormalities of gait and mobility (R26.89);Muscle weakness (generalized) (M62.81);Difficulty in walking, not elsewhere classified (R26.2);Pain Pain - Right/Left: Left Pain - part of body: Hip     Time: 1610-9604 PT Time Calculation (min) (ACUTE ONLY): 13 min  Charges:    $Therapeutic Activity: 8-22 mins PT General Charges $$ ACUTE PT VISIT: 1 Visit                     Vickki Muff, PT, DPT   Acute Rehabilitation Department Office [phone] Secure Chat Communication Preferred   Ronnie Derby 11/20/2022, 4:57 PM

## 2022-11-21 ENCOUNTER — Other Ambulatory Visit (HOSPITAL_COMMUNITY): Payer: Self-pay

## 2022-11-21 DIAGNOSIS — M1612 Unilateral primary osteoarthritis, left hip: Secondary | ICD-10-CM | POA: Diagnosis not present

## 2022-11-21 DIAGNOSIS — Z87891 Personal history of nicotine dependence: Secondary | ICD-10-CM | POA: Diagnosis not present

## 2022-11-21 NOTE — Plan of Care (Signed)
  Problem: Clinical Measurements: Goal: Postoperative complications will be avoided or minimized Outcome: Progressing   Problem: Pain Management: Goal: Pain level will decrease with appropriate interventions Outcome: Progressing   Problem: Skin Integrity: Goal: Will show signs of wound healing Outcome: Progressing   Problem: Education: Goal: Knowledge of General Education information will improve Description: Including pain rating scale, medication(s)/side effects and non-pharmacologic comfort measures Outcome: Progressing   Problem: Health Behavior/Discharge Planning: Goal: Ability to manage health-related needs will improve Outcome: Progressing

## 2022-11-21 NOTE — Progress Notes (Signed)
PT Cancellation Note  Patient Details Name: Donna Huffman MRN: [id number] DOB: 01-30-1961   Cancelled Treatment:    Reason Eval/Treat Not Completed: Other (comment) PT awaiting d/c today. I checked in with the patient who states she is leaving "in a few min" but that all of her mobility questions and concerns have been addressed. No concerns or issues with HEP and has been set up with HHPT. Pt remains safe for d/c home when transportation arrives, no charge.   Vickki Muff, PT, DPT   Acute Rehabilitation Department Office [phone] Secure Chat Communication Preferred    Ronnie Derby 11/21/2022, 1:37 PM

## 2022-11-21 NOTE — Progress Notes (Signed)
Patient being discharged home, self care. PIV removed. DME delivered to bedside. AVS discussed, Patient verbalized understanding. Patient transported off the unit via wheelchair with all her belongings in NAD.

## 2022-11-21 NOTE — Plan of Care (Signed)
  Problem: Education: Goal: Knowledge of the prescribed therapeutic regimen will improve Outcome: Adequate for Discharge Goal: Understanding of discharge needs will improve Outcome: Adequate for Discharge Goal: Individualized Educational Video(s) Outcome: Adequate for Discharge   Problem: Activity: Goal: Ability to avoid complications of mobility impairment will improve Outcome: Adequate for Discharge Goal: Ability to tolerate increased activity will improve Outcome: Adequate for Discharge   Problem: Clinical Measurements: Goal: Postoperative complications will be avoided or minimized Outcome: Adequate for Discharge   Problem: Pain Management: Goal: Pain level will decrease with appropriate interventions Outcome: Adequate for Discharge   Problem: Skin Integrity: Goal: Will show signs of wound healing Outcome: Adequate for Discharge   Problem: Education: Goal: Knowledge of General Education information will improve Description: Including pain rating scale, medication(s)/side effects and non-pharmacologic comfort measures Outcome: Adequate for Discharge   Problem: Health Behavior/Discharge Planning: Goal: Ability to manage health-related needs will improve Outcome: Adequate for Discharge   Problem: Clinical Measurements: Goal: Ability to maintain clinical measurements within normal limits will improve Outcome: Adequate for Discharge Goal: Will remain free from infection Outcome: Adequate for Discharge Goal: Diagnostic test results will improve Outcome: Adequate for Discharge Goal: Respiratory complications will improve Outcome: Adequate for Discharge Goal: Cardiovascular complication will be avoided Outcome: Adequate for Discharge   Problem: Activity: Goal: Risk for activity intolerance will decrease Outcome: Adequate for Discharge   Problem: Nutrition: Goal: Adequate nutrition will be maintained Outcome: Adequate for Discharge   Problem: Coping: Goal: Level of  anxiety will decrease Outcome: Adequate for Discharge   Problem: Elimination: Goal: Will not experience complications related to bowel motility Outcome: Adequate for Discharge Goal: Will not experience complications related to urinary retention Outcome: Adequate for Discharge   Problem: Pain Managment: Goal: General experience of comfort will improve Outcome: Adequate for Discharge   Problem: Safety: Goal: Ability to remain free from injury will improve Outcome: Adequate for Discharge   Problem: Skin Integrity: Goal: Risk for impaired skin integrity will decrease Outcome: Adequate for Discharge   Problem: Education: Goal: Knowledge of General Education information will improve Description: Including pain rating scale, medication(s)/side effects and non-pharmacologic comfort measures Outcome: Adequate for Discharge   Problem: Health Behavior/Discharge Planning: Goal: Ability to manage health-related needs will improve Outcome: Adequate for Discharge   Problem: Clinical Measurements: Goal: Ability to maintain clinical measurements within normal limits will improve Outcome: Adequate for Discharge Goal: Will remain free from infection Outcome: Adequate for Discharge Goal: Diagnostic test results will improve Outcome: Adequate for Discharge Goal: Respiratory complications will improve Outcome: Adequate for Discharge Goal: Cardiovascular complication will be avoided Outcome: Adequate for Discharge   Problem: Activity: Goal: Risk for activity intolerance will decrease Outcome: Adequate for Discharge   Problem: Nutrition: Goal: Adequate nutrition will be maintained Outcome: Adequate for Discharge   Problem: Coping: Goal: Level of anxiety will decrease Outcome: Adequate for Discharge   Problem: Elimination: Goal: Will not experience complications related to bowel motility Outcome: Adequate for Discharge Goal: Will not experience complications related to urinary  retention Outcome: Adequate for Discharge   Problem: Pain Managment: Goal: General experience of comfort will improve Outcome: Adequate for Discharge   Problem: Safety: Goal: Ability to remain free from injury will improve Outcome: Adequate for Discharge   Problem: Skin Integrity: Goal: Risk for impaired skin integrity will decrease Outcome: Adequate for Discharge

## 2022-11-21 NOTE — TOC Progression Note (Addendum)
Transition of Care (TOC) - Progression Note   Patient was prearranged with [facility] for HHPT. Confirmed with Amy with [facility] with patient, she is aware of above. Patient does need 3 in 1 and rolling walker. Order through [employer] . DME will come to patient's room prior to discharge. Her mother in law will provide transportation home at discharge  Patient Details  Name: Donna Huffman MRN: [id number] Date of Birth: 07-Jan-1961  Transition of Care [facility]) CM/SW Contact  Zenaida Tesar, Adria Devon, RN Phone Number: 11/21/2022, 7:16 AM  Clinical Narrative:            Expected Discharge Plan and Services                                               Social Determinants of Health (SDOH) Interventions SDOH Screenings   Food Insecurity: No Food Insecurity (11/18/2022)  Housing: Low Risk  (11/18/2022)  Transportation Needs: No Transportation Needs (11/18/2022)  Utilities: Not At Risk (11/18/2022)  Depression (PHQ2-9): Low Risk  (07/30/2019)  Social Connections: Unknown (07/06/2021)   Received from [facility]  Tobacco Use: Medium Risk (11/18/2022)    Readmission Risk Interventions     No data to display

## 2022-11-22 NOTE — Discharge Summary (Signed)
Patient ID: Donna Huffman MRN: [id number] DOB/AGE: 11/17/1960 62 y.o.  Admit date: 11/18/2022 Discharge date: 11/22/2022  Admission Diagnoses:  Principal Problem:   Primary osteoarthritis of left hip Active Problems:   Status post total replacement of left hip   Discharge Diagnoses:  Same  History reviewed. No pertinent past medical history.  Surgeries: Procedure(s): LEFT TOTAL HIP ARTHROPLASTY ANTERIOR APPROACH on 11/18/2022   Consultants:   Discharged Condition: Improved  Hospital Course: Breigh Mckeag is an 62 y.o. female who was admitted 11/18/2022 for operative treatment ofPrimary osteoarthritis of left hip. Patient has severe unremitting pain that affects sleep, daily activities, and work/hobbies. After pre-op clearance the patient was taken to the operating room on 11/18/2022 and underwent  Procedure(s): LEFT TOTAL HIP ARTHROPLASTY ANTERIOR APPROACH.    Patient was given perioperative antibiotics:  Anti-infectives (From admission, onward)    Start     Dose/Rate Route Frequency Ordered Stop   11/18/22 1500  ceFAZolin (ANCEF) IVPB 2g/100 mL premix        2 g 200 mL/hr over 30 Minutes Intravenous Every 6 hours 11/18/22 1407 11/18/22 2150   11/18/22 1026  vancomycin (VANCOCIN) powder  Status:  Discontinued          As needed 11/18/22 1027 11/18/22 1143   11/18/22 0700  ceFAZolin (ANCEF) IVPB 2g/100 mL premix        2 g 200 mL/hr over 30 Minutes Intravenous On call to O.R. 11/18/22 4098 11/18/22 0943        Patient was given sequential compression devices, early ambulation, and chemoprophylaxis to prevent DVT.  Patient benefited maximally from hospital stay and there were no complications.    Recent vital signs: No data found.   Recent laboratory studies: No results for input(s): "WBC", "HGB", "HCT", "PLT", "NA", "K", "CL", "CO2", "BUN", "CREATININE", "GLUCOSE", "INR", "CALCIUM" in the last 72 hours.  Invalid input(s): "PT", "2"   Discharge Medications:    Allergies as of 11/21/2022       Reactions   Egg-derived Products Tinitus   Aching   Shellfish Allergy Tinitus   Muscle aches        Medication List     STOP taking these medications    baclofen 10 MG tablet Commonly known as: LIORESAL   diclofenac 75 MG EC tablet Commonly known as: VOLTAREN   ibuprofen 800 MG tablet Commonly known as: ADVIL       TAKE these medications    aspirin EC 81 MG tablet Take 1 tablet (81 mg total) by mouth 2 (two) times daily. To be taken after surgery to prevent blood clots   docusate sodium 100 MG capsule Commonly known as: Colace Take 1 capsule (100 mg total) by mouth daily as needed.   methocarbamol 750 MG tablet Commonly known as: Robaxin-750 Take 1 tablet (750 mg total) by mouth 2 (two) times daily as needed.   ondansetron 4 MG tablet Commonly known as: Zofran Take 1 tablet (4 mg total) by mouth every 8 (eight) hours as needed for nausea or vomiting.   oxyCODONE-acetaminophen 5-325 MG tablet Commonly known as: Percocet Take 1-2 tablets by mouth every 8 (eight) hours as needed.        Diagnostic Studies: DG Pelvis Portable  Result Date: 11/18/2022 CLINICAL DATA:  Postop. EXAM: PORTABLE PELVIS 1-2 VIEWS COMPARISON:  None Available. FINDINGS: Left hip arthroplasty in expected alignment. No periprosthetic lucency or fracture. Recent postsurgical change includes air and edema in the soft tissues. IMPRESSION: Left hip arthroplasty without immediate postoperative  complication. Electronically Signed   By: Narda Rutherford M.D.   On: 11/18/2022 13:01   DG HIP UNILAT WITH PELVIS 1V LEFT  Result Date: 11/18/2022 CLINICAL DATA:  Elective surgery. EXAM: DG HIP (WITH OR WITHOUT PELVIS) 1V*L* COMPARISON:  None Available. FINDINGS: Seven fluoroscopic spot views of the pelvis and left hip obtained in the operating room. Sequential images during hip arthroplasty. Fluoroscopy time 30 seconds. Dose 4.33 mGy. IMPRESSION: Intraoperative  fluoroscopy for left hip arthroplasty. Electronically Signed   By: Narda Rutherford M.D.   On: 11/18/2022 13:01   DG C-Arm 1-60 Min-No Report  Result Date: 11/18/2022 Fluoroscopy was utilized by the requesting physician.  No radiographic interpretation.   DG C-Arm 1-60 Min-No Report  Result Date: 11/18/2022 Fluoroscopy was utilized by the requesting physician.  No radiographic interpretation.    Disposition: Discharge disposition: 01-Home or Self Care          Follow-up Information     Cristie Hem, PA-C. Schedule an appointment as soon as possible for a visit in 2 week(s).   Specialty: Orthopedic Surgery Contact information: [address] [place] [address] [phone]         [facility] Follow up.   Specialty: Home Health Services Why: [phone] Contact information: [address] [place] [address] [phone]                  Signed: Cristie Hem 11/22/2022, 10:29 AM

## 2022-12-02 ENCOUNTER — Ambulatory Visit: Payer: Medicaid Other | Admitting: Physician Assistant

## 2022-12-02 ENCOUNTER — Encounter: Payer: Self-pay | Admitting: Physician Assistant

## 2022-12-02 DIAGNOSIS — Z96642 Presence of left artificial hip joint: Secondary | ICD-10-CM

## 2022-12-02 NOTE — Progress Notes (Signed)
Post-Op Visit Note   Patient: Donna Huffman           Date of Birth: [birth year]           MRN: [id number] Visit Date: 12/02/2022 PCP: Alysia Penna, MD   Assessment & Plan:  Chief Complaint:  Chief Complaint  Patient presents with   Left Hip - Follow-up    Left total hip arthroplasty 11/18/2022   Visit Diagnoses:  1. Status post total replacement of left hip     Plan: Patient is a pleasant 62 year old female who comes in today 2 weeks status post left total hip replacement 11/18/2022.  She has been doing well.  She has been taking oxycodone and Robaxin for pain.  She has been compliant taking baby aspirin twice daily for DVT prophylaxis.  She is ambulating with a walker.  It sounds like she may have been in contact with home health physical therapy but she has not been staying in her house.  She is planning on returning home tomorrow and will contact them to come out for evaluation.  Examination of her left hip reveals a well-healing surgical incision with nylon sutures in place.  No evidence of infection or cellulitis.  Calves are soft and nontender.  She is neurovascularly intact distally.  Today, sutures were removed and Steri-Strips applied.  Postop instructions provided.  Continue with a baby aspirin twice daily for DVT prophylaxis.  Follow-up in 4 weeks for repeat evaluation and AP pelvis x-rays.  Call with concerns or questions.  Follow-Up Instructions: Return in about 4 weeks (around 12/30/2022).   Orders:  No orders of the defined types were placed in this encounter.  No orders of the defined types were placed in this encounter.   Imaging: No new imaging  PMFS History: Patient Active Problem List   Diagnosis Date Noted   Status post total replacement of left hip 11/18/2022   Primary osteoarthritis of left hip 11/17/2022   History reviewed. No pertinent past medical history.  Family History  Problem Relation Age of Onset   Cancer Maternal Grandmother 2        cervical   Diabetes Father    Cancer Mother 82       breast   Breast cancer Mother     Past Surgical History:  Procedure Laterality Date   BREAST BIOPSY Right 2012   Benign US Guided core biopsy   NO PAST SURGERIES     TOTAL HIP ARTHROPLASTY Left 11/18/2022   Procedure: LEFT TOTAL HIP ARTHROPLASTY ANTERIOR APPROACH;  Surgeon: Tarry Kos, MD;  Location: [facility] OR;  Service: Orthopedics;  Laterality: Left;  3-C   Social History   Occupational History   Not on file  Tobacco Use   Smoking status: Former    Current packs/day: 0.00    Average packs/day: 0.3 packs/day for 20.0 years (5.0 ttl pk-yrs)    Types: Cigarettes    Quit date: 2023    Years since quitting: 1.7   Smokeless tobacco: Never  Vaping Use   Vaping status: Never Used  Substance and Sexual Activity   Alcohol use: Yes    Comment: socially   Drug use: No   Sexual activity: Not Currently    Birth control/protection: None

## 2022-12-08 ENCOUNTER — Telehealth: Payer: Self-pay | Admitting: Orthopaedic Surgery

## 2022-12-08 NOTE — Telephone Encounter (Signed)
Pt called requesting a medication refill. Oxycodone

## 2022-12-12 ENCOUNTER — Telehealth: Payer: Self-pay | Admitting: Orthopaedic Surgery

## 2022-12-12 MED ORDER — OXYCODONE-ACETAMINOPHEN 5-325 MG PO TABS: 1.0000 | ORAL_TABLET | Freq: Every day | ORAL | 0 refills | Status: AC | PRN

## 2022-12-12 NOTE — Telephone Encounter (Signed)
[facility] called verbal orders for PT 2 week 3 please advise Vinnie Langton [phone] okay to LVM

## 2022-12-12 NOTE — Telephone Encounter (Signed)
Called and gave verbal 

## 2022-12-12 NOTE — Telephone Encounter (Signed)
done

## 2022-12-30 ENCOUNTER — Encounter: Payer: Medicaid Other | Admitting: Orthopaedic Surgery

## 2023-01-03 ENCOUNTER — Ambulatory Visit (INDEPENDENT_AMBULATORY_CARE_PROVIDER_SITE_OTHER): Payer: Medicaid Other | Admitting: Physician Assistant

## 2023-01-03 ENCOUNTER — Other Ambulatory Visit (INDEPENDENT_AMBULATORY_CARE_PROVIDER_SITE_OTHER): Payer: Self-pay

## 2023-01-03 ENCOUNTER — Encounter: Payer: Self-pay | Admitting: Physician Assistant

## 2023-01-03 DIAGNOSIS — Z96642 Presence of left artificial hip joint: Secondary | ICD-10-CM | POA: Diagnosis not present

## 2023-01-03 MED ORDER — HYDROCODONE-ACETAMINOPHEN 5-325 MG PO TABS: 1.0000 | ORAL_TABLET | Freq: Two times a day (BID) | ORAL | 0 refills | Status: AC | PRN

## 2023-01-03 MED ORDER — METHOCARBAMOL 750 MG PO TABS: 750.0000 mg | ORAL_TABLET | Freq: Two times a day (BID) | ORAL | 2 refills | Status: AC | PRN

## 2023-01-03 NOTE — Progress Notes (Signed)
   Post-Op Visit Note   Patient: Donna Huffman           Date of Birth: 1960-09-12           MRN: [id number] Visit Date: 01/03/2023 PCP: Alysia Penna, MD   Assessment & Plan:  Chief Complaint:  Chief Complaint  Patient presents with   Left Hip - Follow-up    Left total hip arthroplasty 11/18/2022   Visit Diagnoses:  1. Status post total replacement of left hip     Plan: Patient is a pleasant 62 year old female who comes in today 6 weeks status post left total hip replacement 11/18/2022.  She has been doing okay.  She has been taking Tylenol for pain which is really not strong enough.  The muscle relaxers do seem to help a little.  She has been compliant taking a baby aspirin twice daily for DVT prophylaxis.  She has been getting home health physical therapy.  Ambulating without assistance.  Examination of the left hip reveals a fully healed surgical scar without complication.  Painless hip flexion and logroll.  She is neurovascularly intact distally.  At this point, I have refilled her Robaxin.  Have also sent in Norco to take sparingly.  She may discontinue the aspirin from a DVT prophylaxis standpoint.  Have sent in a referral for outpatient physical therapy per her request.  She will follow-up with [place] in 6 weeks for repeat evaluation.  Call with concerns or questions.  Follow-Up Instructions: Return in about 6 weeks (around 02/14/2023).   Orders:  Orders Placed This Encounter  Procedures   XR Pelvis 1-2 Views   No orders of the defined types were placed in this encounter.   Imaging: No results found.  PMFS History: Patient Active Problem List   Diagnosis Date Noted   Status post total replacement of left hip 11/18/2022   Primary osteoarthritis of left hip 11/17/2022   History reviewed. No pertinent past medical history.  Family History  Problem Relation Age of Onset   Cancer Maternal Grandmother 90       cervical   Diabetes Father    Cancer Mother 61       breast    Breast cancer Mother     Past Surgical History:  Procedure Laterality Date   BREAST BIOPSY Right 2012   Benign US Guided core biopsy   NO PAST SURGERIES     TOTAL HIP ARTHROPLASTY Left 11/18/2022   Procedure: LEFT TOTAL HIP ARTHROPLASTY ANTERIOR APPROACH;  Surgeon: Tarry Kos, MD;  Location: [facility] OR;  Service: Orthopedics;  Laterality: Left;  3-C   Social History   Occupational History   Not on file  Tobacco Use   Smoking status: Former    Current packs/day: 0.00    Average packs/day: 0.3 packs/day for 20.0 years (5.0 ttl pk-yrs)    Types: Cigarettes    Quit date: 2023    Years since quitting: 1.8   Smokeless tobacco: Never  Vaping Use   Vaping status: Never Used  Substance and Sexual Activity   Alcohol use: Yes    Comment: socially   Drug use: No   Sexual activity: Not Currently    Birth control/protection: None

## 2023-03-07 ENCOUNTER — Ambulatory Visit (HOSPITAL_COMMUNITY)
Admission: EM | Admit: 2023-03-07 | Discharge: 2023-03-07 | Disposition: A | Discharge status: Home / Self Care | Payer: Medicaid Other | Attending: Neurology | Admitting: Neurology

## 2023-03-07 ENCOUNTER — Ambulatory Visit: Payer: Medicaid Other | Admitting: Orthopaedic Surgery

## 2023-03-07 ENCOUNTER — Encounter (HOSPITAL_COMMUNITY): Payer: Self-pay

## 2023-03-07 DIAGNOSIS — J069 Acute upper respiratory infection, unspecified: Secondary | ICD-10-CM | POA: Diagnosis not present

## 2023-03-07 DIAGNOSIS — H66003 Acute suppurative otitis media without spontaneous rupture of ear drum, bilateral: Secondary | ICD-10-CM | POA: Diagnosis not present

## 2023-03-07 DIAGNOSIS — R051 Acute cough: Secondary | ICD-10-CM

## 2023-03-07 MED ORDER — AEROCHAMBER PLUS FLO-VU LARGE MISC
Status: AC
  Filled 2023-03-07: qty 1

## 2023-03-07 MED ORDER — AEROCHAMBER PLUS FLO-VU MISC
1.0000 | Freq: Once | Status: AC
  Administered 2023-03-07: 1

## 2023-03-07 MED ORDER — PROMETHAZINE-DM 6.25-15 MG/5ML PO SYRP: 5.0000 mL | ORAL_SOLUTION | Freq: Four times a day (QID) | ORAL | 0 refills | Status: AC | PRN

## 2023-03-07 MED ORDER — ALBUTEROL SULFATE HFA 108 (90 BASE) MCG/ACT IN AERS
INHALATION_SPRAY | RESPIRATORY_TRACT | Status: AC
  Filled 2023-03-07: qty 6.7

## 2023-03-07 MED ORDER — BENZONATATE 100 MG PO CAPS: 100.0000 mg | ORAL_CAPSULE | Freq: Three times a day (TID) | ORAL | 0 refills | Status: AC

## 2023-03-07 MED ORDER — ALBUTEROL SULFATE HFA 108 (90 BASE) MCG/ACT IN AERS
2.0000 | INHALATION_SPRAY | Freq: Once | RESPIRATORY_TRACT | Status: AC
  Administered 2023-03-07: 2 via RESPIRATORY_TRACT

## 2023-03-07 MED ORDER — AMOXICILLIN-POT CLAVULANATE 875-125 MG PO TABS: 1.0000 | ORAL_TABLET | Freq: Two times a day (BID) | ORAL | 0 refills | Status: AC

## 2023-03-07 NOTE — Discharge Instructions (Addendum)
 Your evaluation shows you have a bacterial sinus/ear infection plus a viral infection of the upper airways of your lungs. Use the following medicines to help with your symptoms:  - Take antibiotic sent to pharmacy as directed to treat ear infection. - Tessalon  perles every 8 hours as needed for cough. - Take Promethazine  DM cough medication to help with your cough at nighttime so that you are able to sleep. Do not drive, drink alcohol, or go to work while taking this medication since it can make you sleepy. Only take this at nighttime.  - Purchase Mucinex over the counter and take this every 12 hours as needed for nasal congestion and cough.  If you develop any new or worsening symptoms or do not improve in the next 2 to 3 days, please return.  If your symptoms are severe, please go to the emergency room.  Follow-up with your primary care provider for further evaluation and management of your symptoms as well as ongoing wellness visits.  I hope you feel better!

## 2023-03-07 NOTE — ED Provider Notes (Signed)
 [facility]    CSN: [id number] Arrival date & time: 03/07/23  1606      History   Chief Complaint Chief Complaint  Patient presents with   Cough    HPI Donna Huffman is a 63 y.o. female.   Symptoms started Sunday ear fullness and a lot of phlegm. Chest pain from coughing. No fever. Has had cold sweats, no thermometer at home. Thought she had a cold approximately 1 week ago. Using mucinex and cough syrup which usually works for her. Symptoms have not improved.  Did use some tylenol  PM and ibuprofen .    Cough   History reviewed. No pertinent past medical history.  Patient Active Problem List   Diagnosis Date Noted   Status post total replacement of left hip 11/18/2022   Primary osteoarthritis of left hip 11/17/2022    Past Surgical History:  Procedure Laterality Date   BREAST BIOPSY Right 2012   Benign US  Guided core biopsy   NO PAST SURGERIES     TOTAL HIP ARTHROPLASTY Left 11/18/2022   Procedure: LEFT TOTAL HIP ARTHROPLASTY ANTERIOR APPROACH;  Surgeon: Jerri Kay HERO, MD;  Location: [facility];  Service: Orthopedics;  Laterality: Left;  3-C    OB History     Gravida  0   Para      Term      Preterm      AB      Living         SAB      IAB      Ectopic      Multiple      Live Births               Home Medications    Prior to Admission medications   Medication Sig Start Date End Date Taking? Authorizing Provider  amoxicillin -clavulanate (AUGMENTIN ) 875-125 MG tablet Take 1 tablet by mouth every 12 (twelve) hours. 03/07/23  Yes Remi Pippin, NP  benzonatate  (TESSALON ) 100 MG capsule Take 1 capsule (100 mg total) by mouth every 8 (eight) hours. 03/07/23  Yes Remi Pippin, NP  promethazine -dextromethorphan (PROMETHAZINE -DM) 6.25-15 MG/5ML syrup Take 5 mLs by mouth 4 (four) times daily as needed for cough. 03/07/23  Yes Remi Pippin, NP  aspirin  EC 81 MG tablet Take 1 tablet (81 mg total) by mouth 2 (two) times daily. To be taken after  surgery to prevent blood clots 11/16/22 11/16/23  Jule Ronal CROME, PA-C  docusate sodium  (COLACE) 100 MG capsule Take 1 capsule (100 mg total) by mouth daily as needed. 11/16/22 11/16/23  Stanbery, Mary L, PA-C  HYDROcodone -acetaminophen  (NORCO) 5-325 MG tablet Take 1-2 tablets by mouth 2 (two) times daily as needed. 01/03/23   Jule Ronal CROME, PA-C  methocarbamol  (ROBAXIN -750) 750 MG tablet Take 1 tablet (750 mg total) by mouth 2 (two) times daily as needed. 11/16/22   Jule Ronal CROME, PA-C  methocarbamol  (ROBAXIN -750) 750 MG tablet Take 1 tablet (750 mg total) by mouth 2 (two) times daily as needed for muscle spasms. 01/03/23   Jule Ronal CROME, PA-C  ondansetron  (ZOFRAN ) 4 MG tablet Take 1 tablet (4 mg total) by mouth every 8 (eight) hours as needed for nausea or vomiting. 11/16/22   Jule Ronal CROME, PA-C  oxyCODONE -acetaminophen  (PERCOCET) 5-325 MG tablet Take 1-2 tablets by mouth daily as needed. 12/12/22   Jerri Kay HERO, MD    Family History Family History  Problem Relation Age of Onset   Cancer Maternal Grandmother 20  cervical   Diabetes Father    Cancer Mother 16       breast   Breast cancer Mother     Social History Social History   Tobacco Use   Smoking status: Former    Current packs/day: 0.00    Average packs/day: 0.3 packs/day for 20.0 years (5.0 ttl pk-yrs)    Types: Cigarettes    Quit date: 2023    Years since quitting: 2.0   Smokeless tobacco: Never  Vaping Use   Vaping status: Never Used  Substance Use Topics   Alcohol use: Yes    Comment: socially   Drug use: No     Allergies   Egg-derived products and Shellfish allergy   Review of Systems Review of Systems  Respiratory:  Positive for cough.      Physical Exam Triage Vital Signs ED Triage Vitals [03/07/23 1715]  Encounter Vitals Group     BP 120/86     Systolic BP Percentile      Diastolic BP Percentile      Pulse Rate 70     Resp 16     Temp 97.9 F (36.6 C)     Temp Source Oral      SpO2 96 %     Weight      Height      Head Circumference      Peak Flow      Pain Score 0     Pain Loc      Pain Education      Exclude from Growth Chart    No data found.  Updated Vital Signs BP 120/86 (BP Location: Left Arm)   Pulse 70   Temp 97.9 F (36.6 C) (Oral)   Resp 16   SpO2 96%   Visual Acuity Right Eye Distance:   Left Eye Distance:   Bilateral Distance:    Right Eye Near:   Left Eye Near:    Bilateral Near:     Physical Exam Constitutional:      Appearance: Normal appearance. She is obese.  HENT:     Right Ear: A middle ear effusion is present. Tympanic membrane is erythematous.     Left Ear: Tympanic membrane is erythematous.     Nose: Nose normal.  Eyes:     Extraocular Movements: Extraocular movements intact.     Pupils: Pupils are equal, round, and reactive to light.  Cardiovascular:     Rate and Rhythm: Normal rate and regular rhythm.  Pulmonary:     Effort: Pulmonary effort is normal.     Breath sounds: Rhonchi present.  Abdominal:     General: Abdomen is flat.     Palpations: Abdomen is soft.  Musculoskeletal:        General: Normal range of motion.     Cervical back: Normal range of motion.  Skin:    General: Skin is warm and dry.     Capillary Refill: Capillary refill takes less than 2 seconds.  Neurological:     Mental Status: She is alert and oriented to person, place, and time. Mental status is at baseline.  Psychiatric:        Mood and Affect: Mood normal.        Behavior: Behavior normal.      UC Treatments / Results  Labs (all labs ordered are listed, but only abnormal results are displayed) Labs Reviewed - No data to display  EKG   Radiology No results found.  Procedures Procedures (  including critical care time)  Medications Ordered in UC Medications  albuterol  (VENTOLIN  HFA) 108 (90 Base) MCG/ACT inhaler 2 puff (has no administration in time range)  Aerochamber Plus device 1 each (has no administration in  time range)    Initial Impression / Assessment and Plan / UC Course  I have reviewed the triage vital signs and the nursing notes.  Pertinent labs & imaging results that were available during my care of the patient were reviewed by me and considered in my medical decision making (see chart for details).  Physical exam findings reassuring, vital signs hemodynamically stable, and lungs clear, therefore deferred imaging of the chest. Advised supportive care/prescriptions for symptomatic relief as outlined in AVS.   AOM on exam, eardrum intact.  Antibiotic prescribed. Supportive care discussed for pain and fever management.    Final Clinical Impressions(s) / UC Diagnoses   Final diagnoses:  Acute cough  Viral URI with cough  Non-recurrent acute suppurative otitis media of both ears without spontaneous rupture of tympanic membranes     Discharge Instructions      Your evaluation shows you have a bacterial sinus/ear infection plus a viral infection of the upper airways of your lungs. Use the following medicines to help with your symptoms:  - Take antibiotic sent to pharmacy as directed to treat ear infection. - Tessalon  perles every 8 hours as needed for cough. - Take Promethazine  DM cough medication to help with your cough at nighttime so that you are able to sleep. Do not drive, drink alcohol, or go to work while taking this medication since it can make you sleepy. Only take this at nighttime.  - Purchase Mucinex over the counter and take this every 12 hours as needed for nasal congestion and cough.  If you develop any new or worsening symptoms or do not improve in the next 2 to 3 days, please return.  If your symptoms are severe, please go to the emergency room.  Follow-up with your primary care provider for further evaluation and management of your symptoms as well as ongoing wellness visits.  I hope you feel better!      ED Prescriptions     Medication Sig Dispense Auth.  Provider   amoxicillin -clavulanate (AUGMENTIN ) 875-125 MG tablet Take 1 tablet by mouth every 12 (twelve) hours. 14 tablet Remi Pippin, NP   promethazine -dextromethorphan (PROMETHAZINE -DM) 6.25-15 MG/5ML syrup Take 5 mLs by mouth 4 (four) times daily as needed for cough. 118 mL Remi Pippin, NP   benzonatate  (TESSALON ) 100 MG capsule Take 1 capsule (100 mg total) by mouth every 8 (eight) hours. 21 capsule Remi Pippin, NP      PDMP not reviewed this encounter.   Remi Pippin, NP 03/07/23 [id number]

## 2023-03-07 NOTE — ED Triage Notes (Signed)
 Pt states cough and ears clogged for the past 2 days.  States she has been taking mucinex at home.

## 2023-04-03 NOTE — Progress Notes (Deleted)
   Office Visit Note   Patient: Donna Huffman           Date of Birth: 09-25-1960           MRN: [id number] Visit Date: 04/04/2023              Requested by: Alysia Penna, MD [address] [place],  [place] [address] PCP: Alysia Penna, MD   Assessment & Plan: Visit Diagnoses:  1. Status post total replacement of left hip   2. Primary osteoarthritis of right hip     Plan: ***  Follow-Up Instructions: No follow-ups on file.   Orders:  No orders of the defined types were placed in this encounter.  No orders of the defined types were placed in this encounter.     Procedures: No procedures performed   Clinical Data: No additional findings.   Subjective: No chief complaint on file.   HPI  Review of Systems   Objective: Vital Signs: There were no vitals taken for this visit.  Physical Exam  Ortho Exam  Specialty Comments:  No specialty comments available.  Imaging: No results found.   PMFS History: Patient Active Problem List   Diagnosis Date Noted   Status post total replacement of left hip 11/18/2022   Primary osteoarthritis of left hip 11/17/2022   No past medical history on file.  Family History  Problem Relation Age of Onset   Cancer Maternal Grandmother 65       cervical   Diabetes Father    Cancer Mother 63       breast   Breast cancer Mother     Past Surgical History:  Procedure Laterality Date   BREAST BIOPSY Right 2012   Benign US Guided core biopsy   NO PAST SURGERIES     TOTAL HIP ARTHROPLASTY Left 11/18/2022   Procedure: LEFT TOTAL HIP ARTHROPLASTY ANTERIOR APPROACH;  Surgeon: Tarry Kos, MD;  Location: [facility];  Service: Orthopedics;  Laterality: Left;  3-C   Social History   Occupational History   Not on file  Tobacco Use   Smoking status: Former    Current packs/day: 0.00    Average packs/day: 0.3 packs/day for 20.0 years (5.0 ttl pk-yrs)    Types: Cigarettes    Quit date: 2023    Years since quitting: 2.1    Smokeless tobacco: Never  Vaping Use   Vaping status: Never Used  Substance and Sexual Activity   Alcohol use: Yes    Comment: socially   Drug use: No   Sexual activity: Not Currently    Birth control/protection: None

## 2023-04-04 ENCOUNTER — Ambulatory Visit: Payer: Medicaid Other | Admitting: Orthopaedic Surgery

## 2023-04-04 DIAGNOSIS — Z96642 Presence of left artificial hip joint: Secondary | ICD-10-CM

## 2023-04-04 DIAGNOSIS — M1611 Unilateral primary osteoarthritis, right hip: Secondary | ICD-10-CM

## 2023-05-23 ENCOUNTER — Ambulatory Visit (INDEPENDENT_AMBULATORY_CARE_PROVIDER_SITE_OTHER): Admitting: Orthopaedic Surgery

## 2023-05-23 ENCOUNTER — Other Ambulatory Visit (INDEPENDENT_AMBULATORY_CARE_PROVIDER_SITE_OTHER): Payer: Self-pay

## 2023-05-23 ENCOUNTER — Encounter: Payer: Self-pay | Admitting: Orthopaedic Surgery

## 2023-05-23 DIAGNOSIS — Z96642 Presence of left artificial hip joint: Secondary | ICD-10-CM

## 2023-05-23 DIAGNOSIS — M1611 Unilateral primary osteoarthritis, right hip: Secondary | ICD-10-CM | POA: Diagnosis not present

## 2023-05-23 NOTE — Progress Notes (Signed)
 Office Visit Note   Patient: Donna Huffman           Date of Birth: 30-Jan-1961           MRN: [id number] Visit Date: 05/23/2023              Requested by: Alysia Penna, MD [address] [place],  [place] [address] PCP: Alysia Penna, MD   Assessment & Plan: Visit Diagnoses:  1. Status post total replacement of left hip   2. Primary osteoarthritis of right hip     Plan: Patient is a 63 year old female 6 months postop from a left total hip arthroplasty and with moderate right hip DJD.  In regards to the right hip recommend continuing with symptomatic treatment with over-the-counter medications.  Dental prophylaxis reinforced.  Recheck left total hip arthroplasty in 6 months with repeat x-rays of the pelvis.  Follow-Up Instructions: Return in about 6 months (around 11/22/2023).   Orders:  Orders Placed This Encounter  Procedures   XR HIP UNILAT W OR W/O PELVIS 2-3 VIEWS LEFT   No orders of the defined types were placed in this encounter.     Procedures: No procedures performed   Clinical Data: No additional findings.   Subjective: Chief Complaint  Patient presents with   Left Hip - Pain    History of left total hip arthroplasty 11/18/2022    HPI Patient is 6 months postop from a left total hip arthroplasty.  She is doing well overall.  Reports some start up pain.  Currently walking the track and doing water aerobics.  Takes over-the-counter medications.  Right hip is occasionally symptomatic. Review of Systems   Objective: Vital Signs: There were no vitals taken for this visit.  Physical Exam  Ortho Exam Examination of the left hip shows fully healed surgical scar.  Fluid painless range of motion.  No trochanteric tenderness. Examination of right hip is unchanged from prior visit. Specialty Comments:  No specialty comments available.  Imaging: XR HIP UNILAT W OR W/O PELVIS 2-3 VIEWS LEFT Result Date: 05/23/2023 X-rays of the pelvis show a stable left  total hip arthroplasty without any complications.  There is moderate degenerative arthritis of the right hip with significant joint space narrowing.    PMFS History: Patient Active Problem List   Diagnosis Date Noted   Primary osteoarthritis of right hip 05/23/2023   Status post total replacement of left hip 11/18/2022   Primary osteoarthritis of left hip 11/17/2022   History reviewed. No pertinent past medical history.  Family History  Problem Relation Age of Onset   Cancer Maternal Grandmother 15       cervical   Diabetes Father    Cancer Mother 34       breast   Breast cancer Mother     Past Surgical History:  Procedure Laterality Date   BREAST BIOPSY Right 2012   Benign US Guided core biopsy   NO PAST SURGERIES     TOTAL HIP ARTHROPLASTY Left 11/18/2022   Procedure: LEFT TOTAL HIP ARTHROPLASTY ANTERIOR APPROACH;  Surgeon: Tarry Kos, MD;  Location: [facility];  Service: Orthopedics;  Laterality: Left;  3-C   Social History   Occupational History   Not on file  Tobacco Use   Smoking status: Former    Current packs/day: 0.00    Average packs/day: 0.3 packs/day for 20.0 years (5.0 ttl pk-yrs)    Types: Cigarettes    Quit date: 2023    Years since quitting: 2.2  Smokeless tobacco: Never  Vaping Use   Vaping status: Never Used  Substance and Sexual Activity   Alcohol use: Yes    Comment: socially   Drug use: No   Sexual activity: Not Currently    Birth control/protection: None

## 2024-01-15 ENCOUNTER — Other Ambulatory Visit: Payer: Self-pay | Admitting: Physician Assistant

## 2024-01-15 DIAGNOSIS — Z1231 Encounter for screening mammogram for malignant neoplasm of breast: Secondary | ICD-10-CM

## 2024-08-21 ENCOUNTER — Ambulatory Visit: Admitting: Physician Assistant
